# Patient Record
Sex: Male | Born: 1960
Health system: Southern US, Community
[De-identification: ages and names within clinical notes are randomized; demographics above are authoritative.]

## PROBLEM LIST (undated history)

## (undated) DIAGNOSIS — N200 Calculus of kidney: Secondary | ICD-10-CM

## (undated) DIAGNOSIS — I10 Essential (primary) hypertension: Secondary | ICD-10-CM

## (undated) DIAGNOSIS — F17200 Nicotine dependence, unspecified, uncomplicated: Secondary | ICD-10-CM

## (undated) DIAGNOSIS — M549 Dorsalgia, unspecified: Secondary | ICD-10-CM

## (undated) DIAGNOSIS — K635 Polyp of colon: Secondary | ICD-10-CM

## (undated) DIAGNOSIS — G950 Syringomyelia and syringobulbia: Secondary | ICD-10-CM

## (undated) HISTORY — DX: Essential (primary) hypertension: I10

## (undated) HISTORY — DX: Calculus of kidney: N20.0

## (undated) HISTORY — DX: Syringomyelia and syringobulbia: G95.0

## (undated) HISTORY — DX: Nicotine dependence, unspecified, uncomplicated: F17.200

## (undated) HISTORY — DX: Polyp of colon: K63.5

## (undated) HISTORY — PX: HERNIA REPAIR: SHX51

## (undated) HISTORY — DX: Dorsalgia, unspecified: M54.9

---
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
## 1998-04-18 ENCOUNTER — Ambulatory Visit (HOSPITAL_COMMUNITY): Admission: RE | Admit: 1998-04-18 | Discharge: 1998-04-18 | Payer: Self-pay | Admitting: Gastroenterology

## 2003-01-22 ENCOUNTER — Encounter: Payer: Self-pay | Admitting: Cardiology

## 2003-01-22 ENCOUNTER — Inpatient Hospital Stay (HOSPITAL_COMMUNITY): Admission: EM | Admit: 2003-01-22 | Discharge: 2003-01-24 | Payer: Self-pay | Admitting: Emergency Medicine

## 2003-01-22 ENCOUNTER — Encounter: Payer: Self-pay | Admitting: Emergency Medicine

## 2003-01-23 ENCOUNTER — Encounter: Payer: Self-pay | Admitting: Cardiology

## 2003-01-31 ENCOUNTER — Encounter: Admission: RE | Admit: 2003-01-31 | Discharge: 2003-01-31 | Payer: Self-pay | Admitting: Family Medicine

## 2003-01-31 ENCOUNTER — Encounter: Payer: Self-pay | Admitting: Family Medicine

## 2007-10-21 ENCOUNTER — Emergency Department (HOSPITAL_COMMUNITY): Admission: EM | Admit: 2007-10-21 | Discharge: 2007-10-21 | Payer: Self-pay | Admitting: Emergency Medicine

## 2009-08-09 ENCOUNTER — Emergency Department (HOSPITAL_COMMUNITY): Admission: EM | Admit: 2009-08-09 | Discharge: 2009-08-10 | Payer: Self-pay | Admitting: Emergency Medicine

## 2010-07-22 LAB — DIFFERENTIAL
Eosinophils Absolute: 0.2 10*3/uL (ref 0.0–0.7)
Lymphs Abs: 1.5 10*3/uL (ref 0.7–4.0)
Monocytes Absolute: 0.7 10*3/uL (ref 0.1–1.0)
Monocytes Relative: 7 % (ref 3–12)
Neutro Abs: 7.4 10*3/uL (ref 1.7–7.7)
Neutrophils Relative %: 75 % (ref 43–77)

## 2010-07-22 LAB — POCT CARDIAC MARKERS
CKMB, poc: 4.7 ng/mL (ref 1.0–8.0)
Myoglobin, poc: 137 ng/mL (ref 12–200)
Myoglobin, poc: 184 ng/mL (ref 12–200)
Myoglobin, poc: 228 ng/mL (ref 12–200)
Troponin i, poc: <0.05 ng/mL (ref 0.00–0.09)

## 2010-07-22 LAB — CBC
HCT: 45.3 % (ref 39.0–52.0)
MCHC: 33.4 g/dL (ref 30.0–36.0)

## 2010-07-22 LAB — COMPREHENSIVE METABOLIC PANEL
AST: 59 U/L — ABNORMAL HIGH (ref 0–37)
Alkaline Phosphatase: 55 U/L (ref 39–117)
Calcium: 9.1 mg/dL (ref 8.4–10.5)
Chloride: 106 mEq/L (ref 96–112)
Total Bilirubin: 0.6 mg/dL (ref 0.3–1.2)

## 2010-07-22 LAB — URINALYSIS, ROUTINE W REFLEX MICROSCOPIC
Bilirubin Urine: NEGATIVE
Hgb urine dipstick: NEGATIVE
Specific Gravity, Urine: 1.025 (ref 1.005–1.030)

## 2010-07-22 LAB — URINE MICROSCOPIC-ADD ON

## 2010-09-18 NOTE — H&P (Signed)
NAME:  Evan Wallace, Evan Wallace NO.:  [id number]   MEDICAL RECORD NO.:  [id number]                   PATIENT TYPE:  EMS   LOCATION:  [facility]                                 FACILITY:  [facility]   PHYSICIAN:  Rollene Rotunda, M.D.                DATE OF BIRTH:  1961/02/13   DATE OF ADMISSION:  01/22/2003  DATE OF DISCHARGE:                                HISTORY & PHYSICAL   REASON FOR PRESENTATION:  Back pain with chest pain.   HISTORY OF PRESENT ILLNESS:  The patient is a 50 year old gentleman with no  prior cardiac history.  He reports chest pain for the past 6-7 days.  This  has been occurring at night.  It wakes him from his sleep.  He describes a  substernal pressure.  It is somewhat different than his previous pain he has  had related to hiatal hernia.  It is higher than that previous pain.  It has  been  radiating through to his back.  He has had no radiation to his jaw or  to his arms.  The severe symptoms persist for several minutes.  It seems to  ease but may be noticeable throughout the night.  He wakes in the morning  and goes to work and does not notice pain during the day.  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  He cannot bring on the chest discomfort  with this.  There has been no associated nausea, vomiting, diaphoresis.  He  has had no palpitations, presyncope or syncope.   PAST MEDICAL HISTORY:  1. Esophageal reflux/hiatal hernia.  2. Hyperlipidemia.  3. Tobacco use.   PAST SURGICAL HISTORY:  1. Left herniorrhaphy.  2. Tonsillectomy.   ALLERGIES:  None.   MEDICATIONS:  1. Lipitor 10 mg q.h.s.  2. Prevacid 30 mg q.d. (he has been taking this consistently for 3-4 days).   SOCIAL HISTORY:  The patient lives in [place] with his wife.  He works in  Therapist, music, and he hand makes spare parts.  He has been smoking  one-half pack per day for approximately eight years.   FAMILY HISTORY:  Noncontributory for early coronary  artery disease.   REVIEW OF SYSTEMS:  Positive for nocturia and otherwise as stated in the  HPI.  Negative for all other systems.   PHYSICAL EXAMINATION:  GENERAL:  The patient is in no distress.  VITAL SIGNS:  Blood pressure 142/94, heart rate 56 and regular, afebrile.  HEENT:  Eyes unremarkable.  Pupils equal, round and react to light.  Fundi  not visualized.  Oral mucosa unremarkable.  NECK:  No jugular venous distention, wave form within normal limits.  Carotid upstroke brisk and symmetric, no bruits, thyromegaly.  LYMPHATICS:  No cervical, axillary, inguinal adenopathy.  LUNGS:  Clear to auscultation bilaterally.  BACK:  No costovertebral angle tenderness.  CHEST:  Unremarkable.  HEART:  PMI  not displaced or sustained.  S1 and S2 within normal limits, no  S3, no S4, no murmurs.  ABDOMEN:  Flat, positive bowel sounds, normal in frequency and pitch.  No  bruits, rebound, guarding.  No midline pulsatile mass, hepatomegaly,  splenomegaly.  SKIN:  No rash, nodules.  EXTREMITIES:  2+ pulses without clubbing, cyanosis or edema.  NEUROLOGIC:  Oriented to person, place and time.  Cranial nerves II-XII  grossly intact.  Motor grossly intact throughout.   LABORATORY DATA:  Sinus rhythm, axis within normal limits, intervals within  normal limits.  No acute ST/T wave changes.   WBC 8.6, hemoglobin 15.1, platelets 221; sodium 137, potassium 3.6, BUN 10,  creatinine 1; INR 0.9; AST 34, ALT 55, alkaline phosphatase 66; CK 329, MB  6.9, troponin less than 0.01.   ASSESSMENT/PLAN:  1. Chest pain.  The patient's chest pain has predominately atypical     features.  There is no objective evidence of ischemia; however, he does     have cardiovascular risk factors.  The plan will be observation and rule     out for myocardial infarction.  He will have an exercise Cardiolite in     the a.m. if his enzymes are negative.  2. Tobacco.  He will be educated about the need to stop smoking.  3. Risk  reduction.  We can check a lipid profile.  He will continue his     Lipitor.  4. Hypertension.  His blood pressure is slightly elevated today; however, he     reports that it has been well controlled previously.  We will educate     about therapeutic lifestyle changes.  Will have a low threshold for     initiating, perhaps, a diuretic if he remains hypertensive during his     hospitalization.                                                Rollene Rotunda, M.D.    JH/MEDQ  D:  01/22/2003  T:  01/23/2003  Job:  [id number]

## 2010-09-18 NOTE — Discharge Summary (Signed)
NAME:  Evan Wallace, Evan Wallace NO.:  [id number]   MEDICAL RECORD NO.:  [id number]                   PATIENT TYPE:  INP   LOCATION:  2024                                 FACILITY:  [facility]   PHYSICIAN:  Rollene Rotunda, M.D.                DATE OF BIRTH:  Oct 09, 1960   DATE OF ADMISSION:  01/22/2003  DATE OF DISCHARGE:  01/24/2003                           DISCHARGE SUMMARY - REFERRING   CARDIOLOGIST ON ADMISSION:  Rollene Rotunda, M.D.   PRIMARY CARE PHYSICIAN:  [place]ley C. Andrey Campanile, M.D.   DISCHARGE DIAGNOSES:  1. Admitted with substernal chest pressure, which awakens patient from sleep     at night over the last week.  2. Cardiac enzymes are negative.  3. Exercise Cardiolite study showed no evidence of ischemia with an ejection     fraction of 59%.   SECONDARY DIAGNOSES:  1. Esophageal reflux/hiatal hernia.  2. Hyperlipidemia.  3. History of long-term continued tobacco habituation.  4. Status post left herniorrhaphy.  5. Status post tonsillectomy.   PROCEDURE:  January 23, 2003 exercise Cardiolite study, which was negative  for ischemia; ejection fraction 59%.  The patient was unable to exercise to  maximum heart rate and the study was aborted early secondary to fatigue and  dyspnea.   DISCHARGE DISPOSITION:  Mr. Evan Wallace was ready for discharge,  September 23rd.  He has been afebrile this hospitalization.  His mental  status is clear.  He is ambulating independently.  He has had no recurrence  of his chest pain; and, in fact, has been sleeping well.   LABORATORY DATA:  The patient's cardiac enzymes on admission, September 21st  at midnight:  CK was 254 and CK MB 5.  Troponin I 0.01.  September 21st at  1550 hours:  CK 329, CK MB 6.9 and troponin I less than 0.01.  September  22nd at 1745 hours:  CK 206, CK MB 4.9 and troponin I less than 0.01.  September 23rd at 0155 hours:  CK 172, CK MB 3.9 and troponin I 0.01.   BRIEF HISTORY:  The patient  is a 50 year old gentleman.  He has no prior  cardiac history.  He reports chest pain for the past six to seven days.  This has been occurring at night. It wakes him up from sleep.  He describes  it as a substernal pressure.  It is somewhat different from the previous  pain he has had related to a hiatal hernia.  It is higher than the previous  pain.  It has radiated through to the back.  He has no radiation to the jaw  or arms.  The severe symptoms persist for several minutes.  The symptoms  ease, but may be noticeable throughout the night.  He awakes in the morning,  goes to work and does not notice pain during the day.  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  He cannot bring on chest discomfort with  exertion.  There has been no associated nausea, vomiting or diaphoresis.  He  has had no history of palpitations or presyncope, or syncope.  The patient  will be admitted.  He will have an exercise Cardiolite.  If this is positive  he will go to left heart catheterization and if it is negative he will be  discharged.   HOSPITAL COURSE:  The patient was admitted with a one-week history of  substernal chest pain, which awoke him at night.  This resolved and he was  able to sleep through the rest of the night.  It did not occur during the  day at work.  He underwent exercise Cardiolite study September 22nd and this  was negative.  The patient had a full set of cardiac enzymes, which have  been dictated above; these were essentially negative.  The patient was  discharged September 23rd.   During this hospitalization he has had a smoking cessation consult.  The  benefits and risks were stressed.  He will have follow up from our smoking  cessation counselor.   ADDITIONAL LABORATORY DATA:  Complete blood count on admission:  Hemoglobin  15.1, hematocrit 45, white cells 8.6 and platelets 221,000.  Serum  electrolytes on admission: Sodium 137, potassium 3.6, chloride 104,   bicarbonate 27, BUN 10, creatinine 1, and glucose 77.  Coagulation studies:  PT 12.7, INR 0.9 and PTT 29. Alk phosphatase 66, SGPT 34, SGPT 55.  Lipid  profile:  Cholesterol 120 triglycerides 96, HDL cholesterol 29 and LDL  cholesterol 72.  His LDL cholesterol was well-controlled on the Lipitor 10  mg he does take; however, his HDL cholesterol was low and the subject of  Niaspan had been discussed with the patient.  He stated he would take this  up with his primary caregiver, Dr. Margrett Rud.   DISCHARGE MEDICATIONS:  Mr. Evan Wallace goes home on the following  medications:  1. Enteric coated aspirin 81 mg daily.  2. Prevacid 3 mg daily.  3. Lipitor 10 mg daily at bedtime.   DISCHARGE ACTIVITIES:  No restrictions.   DISCHARGE DIET:  Low-sodium, low-cholesterol diet.   FOLLOW UP:  The patient is to follow up with Dr. Andrey Campanile who will have the  results of this hospitalization and the tests.        [name], P.A.                    Rollene Rotunda, M.D.    GM/MEDQ  D:  01/24/2003  T:  01/24/2003  Job:  [id number]   cc:   [facility]   [place] C. Andrey Campanile, M.D.  [address]  [place]  [place] [address]  Fax: [phone]

## 2011-01-28 LAB — URINE MICROSCOPIC-ADD ON

## 2011-01-28 LAB — DIFFERENTIAL
Eosinophils Relative: 2
Monocytes Absolute: 0.4
Monocytes Relative: 6
Neutrophils Relative %: 70

## 2011-01-28 LAB — URINALYSIS, ROUTINE W REFLEX MICROSCOPIC
Bilirubin Urine: NEGATIVE
Leukocytes, UA: NEGATIVE
Nitrite: NEGATIVE
Protein, ur: NEGATIVE
Urobilinogen, UA: 0.2

## 2011-01-28 LAB — CBC
HCT: 42.8
Hemoglobin: 14.5
MCHC: 34
RBC: 4.72

## 2011-01-28 LAB — POCT I-STAT, CHEM 8
BUN: 22
Chloride: 107
Creatinine, Ser: 1.3
Glucose, Bld: 141 — ABNORMAL HIGH
HCT: 45
Potassium: 4.5

## 2011-11-20 IMAGING — CT CT CERVICAL SPINE W/O CM
2 of 6 series · 8 of 30 positions shown, 9 images · non-contrast
Comparison: None.

CT HEAD

CLINICAL DATA: Syncope with slurred speech. History of
hypertension.  History of tobacco use disorder.

CT HEAD WITHOUT CONTRAST
CT CERVICAL SPINE WITHOUT CONTRAST
TECHNIQUE: Multidetector CT imaging of the head and cervical spine
was performed following the standard protocol without intravenous
contrast.  Multiplanar CT image reconstructions of the cervical
spine were also generated.

[Series 4: cervical spine · axial · 0.27mm/px · z∈[-268,-208]mm · 2 of 72 slices shown, 3 images]
[im 24/72  soft-tissue]
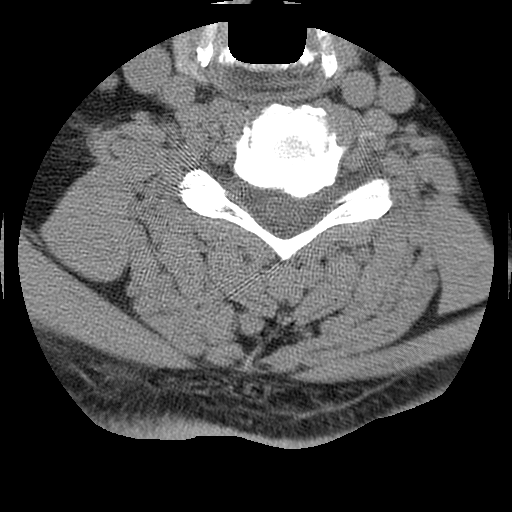
[im 24/72  bone]
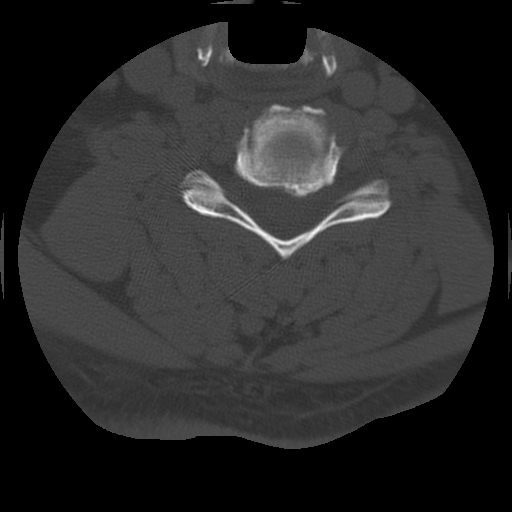
[im 48/72  bone]
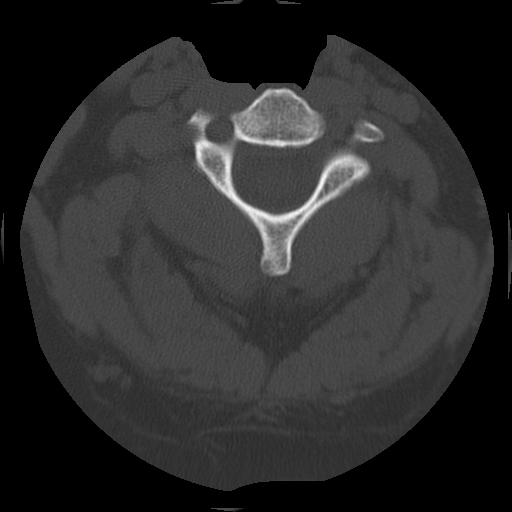

[Series 600: cor · coronal · 0.36mm/px · 6 of 34 slices shown]
[im 7/34  soft-tissue]
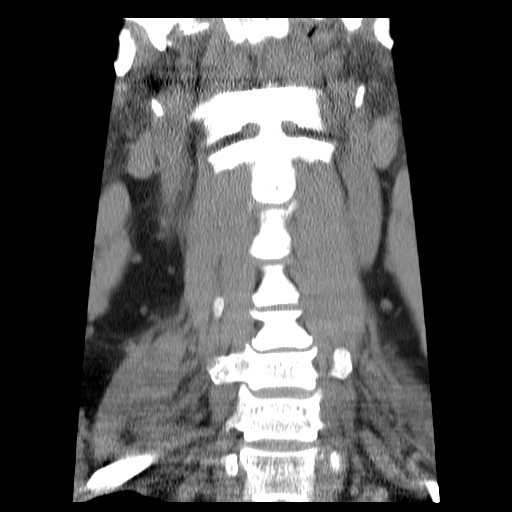
[im 12/34  bone]
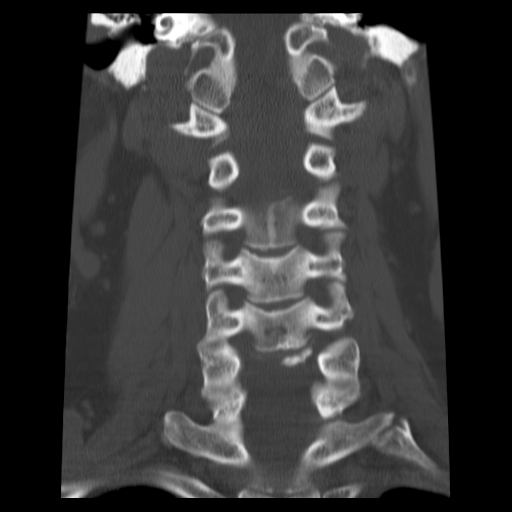
[im 14/34  bone]
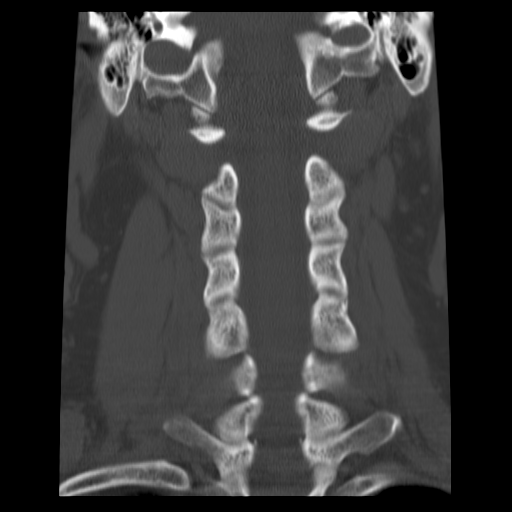
[im 17/34  bone]
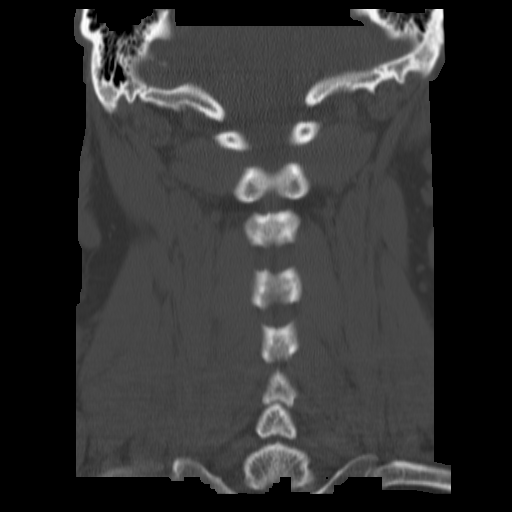
[im 20/34  bone]
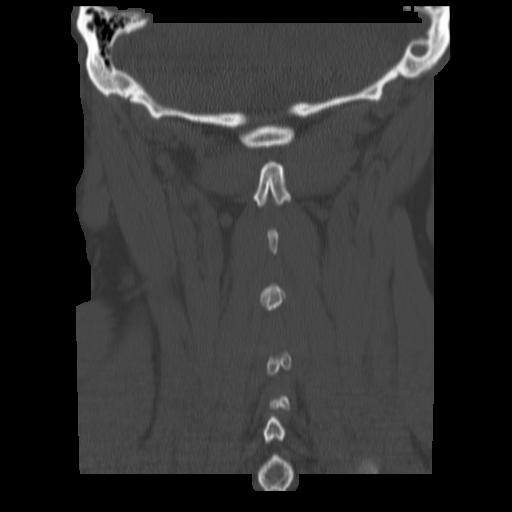
[im 23/34  bone]
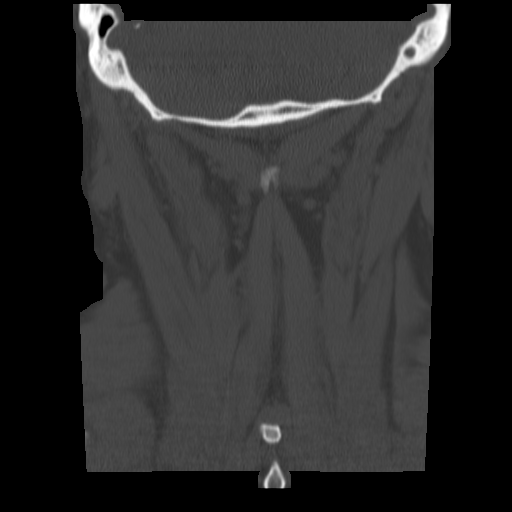

[8 of 30 positions shown; findings below may reference images not displayed]

FINDINGS: There is no evidence for acute infarction, intracranial
hemorrhage, mass lesion, hydrocephalus, or extra-axial fluid. There
is mild atrophy and chronic microvascular ischemic change.
Premature vascular calcification is seen in the carotid siphon
regions.  Calvarium is intact.  Sinuses show mild chronic mucosal
thickening and retention cyst formation.  Mastoid air cells are
clear.
IMPRESSION: No acute intracranial findings.  Mild atrophy and small vessel
disease.

CT CERVICAL SPINE
FINDINGS: There is mild reversal of the normal cervical lordotic
curve which is likely positional, although potentially due to
spasm.  Moderate spondylosis is present in the lower cervical spine
particularly at C5-6.  There is no fracture or traumatic
subluxation.  No neck masses are seen.
IMPRESSION: Cervical spondylosis.  No acute fracture or traumatic subluxation.
Mild reversal of the normal cervical lordotic curve of uncertain
significance.

## 2013-11-13 ENCOUNTER — Encounter: Payer: Self-pay | Admitting: Family Medicine

## 2013-11-13 ENCOUNTER — Ambulatory Visit (INDEPENDENT_AMBULATORY_CARE_PROVIDER_SITE_OTHER): Payer: BC Managed Care – PPO | Admitting: Family Medicine

## 2013-11-13 VITALS — BP 110/76 | HR 55 | Temp 98.7°F | Ht 71.0 in | Wt 225.5 lb

## 2013-11-13 DIAGNOSIS — Z800 Family history of malignant neoplasm of digestive organs: Secondary | ICD-10-CM | POA: Insufficient documentation

## 2013-11-13 DIAGNOSIS — F17200 Nicotine dependence, unspecified, uncomplicated: Secondary | ICD-10-CM | POA: Insufficient documentation

## 2013-11-13 DIAGNOSIS — E669 Obesity, unspecified: Secondary | ICD-10-CM | POA: Insufficient documentation

## 2013-11-13 DIAGNOSIS — Z7189 Other specified counseling: Secondary | ICD-10-CM

## 2013-11-13 DIAGNOSIS — I10 Essential (primary) hypertension: Secondary | ICD-10-CM

## 2013-11-13 DIAGNOSIS — Z7689 Persons encountering health services in other specified circumstances: Secondary | ICD-10-CM

## 2013-11-13 HISTORY — DX: Nicotine dependence, unspecified, uncomplicated: F17.200

## 2013-11-13 NOTE — Patient Instructions (Addendum)
-  PLEASE SIGN UP FOR MYCHART TODAY   We recommend the following healthy lifestyle measures: - eat a healthy diet consisting of lots of vegetables, fruits, beans, nuts, seeds, healthy meats such as white chicken and fish and whole grains.  - avoid fried foods, fast food, processed foods, sodas, red meet and other fattening foods.  - get a least 150 minutes of aerobic exercise per week.   Please quit smoking - call for a quit coach and let us know how we can help  Schedule colonoscopy  Follow up in: 3-4 months for a morning appointment for your physical, come fasting

## 2013-11-13 NOTE — Progress Notes (Signed)
Pre visit review using our clinic review tool, if applicable. No additional management support is needed unless otherwise documented below in the visit note. 

## 2013-11-13 NOTE — Progress Notes (Signed)
No chief complaint on file.   HPI:  Evan Wallace is here to establish care. Used to see Dr. Lisbeth Ply but he did not like the staff appointment. Last PCP and physical: has been a few years  Has the following chronic problems and concerns today:  Patient Active Problem List   Diagnosis Date Noted  . Essential hypertension, benign 11/13/2013  . Tobacco use disorder 11/13/2013  . Obesity, unspecified 11/13/2013  . FH: colon cancer 11/13/2013   Hx of mid back pain: -seeing Dr. Nelva Bush at [facility] ortho -he has had imaging and MRI, has cyst in his spine that they are monitoring -getting PT for L shoulder too -doing better, was taking neurontin - but weaning off of this as not needing this -denies: fevers, weakness or numbness  HTN: -started on lisinopril in 2010 or so -stable -denies: CP, SOB, swelling  Tobacco Use: -3/4 ppd for for 10-12 years -feels like he could quit -not really motivated to quit -reports is his only vice and he likes it -aware of options to quit  ROS negative for unless reported above: fevers, unintentional weight loss, hearing or vision loss, chest pain, palpitations, struggling to breath, hemoptysis, melena, hematochezia, hematuria, falls, loc, si, thoughts of self harm  FH colon cancer - he called to set up colonoscopy today, getting this in October, last in 2007  Past Medical History  Diagnosis Date  . Back pain   . Hypertension   . Kidney stones   . Colon polyps     Family History  Problem Relation Age of Onset  . Hypertension Mother   . Hypertension Father   . Kidney disease Father   . Cancer Father 67    colon cancer    History   Social History  . Marital Status: Married    Spouse Name: N/A    Number of Children: N/A  . Years of Education: N/A   Social History Main Topics  . Smoking status: Current Every Day Smoker -- 1.00 packs/day  . Smokeless tobacco: None  . Alcohol Use: None  . Drug Use: None  . Sexual Activity: None    Other Topics Concern  . None   Social History Narrative   Work or School: works for [employer] Situation: lives with wife      Spiritual Beliefs: methodist      Lifestyle: no regular exercise; so so             Current outpatient prescriptions:gabapentin (NEURONTIN) 300 MG capsule, Take 300 mg by mouth 2 (two) times daily., Disp: , Rfl: ;  lisinopril (PRINIVIL,ZESTRIL) 40 MG tablet, Take 40 mg by mouth daily., Disp: , Rfl:   EXAM:  Filed Vitals:   11/13/13 1057  BP: 110/76  Pulse: 55  Temp: 98.7 F (37.1 C)    Body mass index is 31.46 kg/(m^2).  GENERAL: vitals reviewed and listed above, alert, oriented, appears well hydrated and in no acute distress  HEENT: atraumatic, conjunttiva clear, no obvious abnormalities on inspection of external nose and ears  NECK: no obvious masses on inspection  LUNGS: clear to auscultation bilaterally, no wheezes, rales or rhonchi, good air movement  CV: HRRR, no peripheral edema  MS: moves all extremities without noticeable abnormality  PSYCH: pleasant and cooperative, no obvious depression or anxiety  ASSESSMENT AND PLAN:  Discussed the following assessment and plan:  Essential hypertension, benign  Tobacco use disorder  Obesity, unspecified  FH: colon cancer  Encounter to establish care   -We reviewed the PMH, PSH, FH, SH, Meds and Allergies. -We provided refills for any medications we will prescribe as needed. -We addressed current concerns per orders and patient instructions. -We have asked for records for pertinent exams, studies, vaccines and notes from previous providers. -We have advised patient to follow up per instructions below. -smoking cessation counseling for 3-10 minutes, discussed options, quit coach info provided, he will let us know if he wants to quit  -Patient advised to return or notify a doctor immediately if symptoms worsen or persist or new concerns  arise.  Patient Instructions  -PLEASE SIGN UP FOR MYCHART TODAY   We recommend the following healthy lifestyle measures: - eat a healthy diet consisting of lots of vegetables, fruits, beans, nuts, seeds, healthy meats such as white chicken and fish and whole grains.  - avoid fried foods, fast food, processed foods, sodas, red meet and other fattening foods.  - get a least 150 minutes of aerobic exercise per week.   Please quit smoking - call for a quit coach and let us know how we can help  Schedule colonoscopy  Follow up in: 3-4 months for a morning appointment for your physical, come fasting      Savera Donson R.

## 2013-11-14 ENCOUNTER — Telehealth: Payer: Self-pay | Admitting: Family Medicine

## 2013-11-14 NOTE — Telephone Encounter (Signed)
Relevant patient education mailed to patient.  

## 2013-11-14 NOTE — Telephone Encounter (Signed)
Relevant patient education assigned to patient using Emmi. ° °

## 2013-11-21 ENCOUNTER — Encounter: Payer: Self-pay | Admitting: *Deleted

## 2013-11-30 ENCOUNTER — Encounter: Payer: Self-pay | Admitting: Family Medicine

## 2013-11-30 DIAGNOSIS — M5030 Other cervical disc degeneration, unspecified cervical region: Secondary | ICD-10-CM | POA: Insufficient documentation

## 2013-11-30 DIAGNOSIS — G950 Syringomyelia and syringobulbia: Secondary | ICD-10-CM

## 2013-11-30 HISTORY — DX: Syringomyelia and syringobulbia: G95.0

## 2013-12-12 ENCOUNTER — Telehealth: Payer: Self-pay | Admitting: Family Medicine

## 2013-12-12 NOTE — Telephone Encounter (Signed)
Pt states dr Maudie Mercury told him he could start chantix when ready to stop smoling.. Pt is ready!! [facility]/ [place]

## 2013-12-14 ENCOUNTER — Telehealth: Payer: Self-pay | Admitting: *Deleted

## 2013-12-14 MED ORDER — VARENICLINE TARTRATE 0.5 MG X 11 & 1 MG X 42 PO MISC: ORAL | Status: DC

## 2013-12-14 MED ORDER — VARENICLINE TARTRATE 1 MG PO TABS: 1.0000 mg | ORAL_TABLET | Freq: Two times a day (BID) | ORAL | Status: DC

## 2013-12-14 NOTE — Telephone Encounter (Signed)
YAY! Please congratulate him! Please send starter pack and continuation pack (1 refill on continuation pack). He can review adverse effects again with pharmacist. Please advise him to follow up with me about 1 month after starting this medication. Thanks!!!

## 2013-12-14 NOTE — Telephone Encounter (Signed)
Rx was sent. I called the pt and left a detailed message at his home phone number with the information below and asked that he call to schedule the follow up visit.

## 2013-12-14 NOTE — Telephone Encounter (Signed)
Rx for the Chantix starter pak was printed and I called this in to [facility] at [phone].

## 2014-01-08 ENCOUNTER — Telehealth: Payer: Self-pay | Admitting: Family Medicine

## 2014-01-08 NOTE — Telephone Encounter (Signed)
Pt called would like to know how long he need to keep taking chantix . He said he has not smoked in 3 weeks

## 2014-01-09 NOTE — Telephone Encounter (Signed)
GREAT! So happy about this!Usually advise 2.5 to 3 months after quit date - usually 3 months total. Usually advise follow up with me 6 weeks after starting the chantix.

## 2014-01-09 NOTE — Telephone Encounter (Signed)
Patient informed and he has an appt on 10/20 for his physical and will discuss at that time.

## 2014-01-09 NOTE — Telephone Encounter (Signed)
I left a message at the pts home number to return my call. 

## 2014-01-10 ENCOUNTER — Telehealth: Payer: Self-pay | Admitting: Family Medicine

## 2014-01-10 NOTE — Telephone Encounter (Signed)
I called the pt and he stated he has not been sleeping well for the past week and he read the pamphlet and noticed this was a side effect of the Chantix.  Message forwarded to Dr Maudie Mercury.

## 2014-01-10 NOTE — Telephone Encounter (Signed)
Pt would like to speak w/ you about his chantix rx

## 2014-01-11 NOTE — Telephone Encounter (Signed)
Patient informed and states he has noticed a difference in the dreams he has had, will begin taking his every other night and will call back if needed.

## 2014-01-11 NOTE — Telephone Encounter (Signed)
This may be more related to the nicotine withdrawal - however if having bad nightmares or night terrors would advise trying to take the chantix every other night. Could try this regardless and see if sleep improves. Call if further questions - so happy he quit!

## 2014-02-19 ENCOUNTER — Ambulatory Visit (INDEPENDENT_AMBULATORY_CARE_PROVIDER_SITE_OTHER): Payer: BC Managed Care – PPO | Admitting: Family Medicine

## 2014-02-19 ENCOUNTER — Encounter: Payer: Self-pay | Admitting: Family Medicine

## 2014-02-19 VITALS — BP 138/90 | HR 57 | Temp 98.2°F | Ht 71.0 in | Wt 229.5 lb

## 2014-02-19 DIAGNOSIS — F17200 Nicotine dependence, unspecified, uncomplicated: Secondary | ICD-10-CM

## 2014-02-19 DIAGNOSIS — D229 Melanocytic nevi, unspecified: Secondary | ICD-10-CM

## 2014-02-19 DIAGNOSIS — G950 Syringomyelia and syringobulbia: Secondary | ICD-10-CM

## 2014-02-19 DIAGNOSIS — I10 Essential (primary) hypertension: Secondary | ICD-10-CM

## 2014-02-19 DIAGNOSIS — E669 Obesity, unspecified: Secondary | ICD-10-CM

## 2014-02-19 DIAGNOSIS — Z23 Encounter for immunization: Secondary | ICD-10-CM

## 2014-02-19 DIAGNOSIS — Z800 Family history of malignant neoplasm of digestive organs: Secondary | ICD-10-CM

## 2014-02-19 DIAGNOSIS — D239 Other benign neoplasm of skin, unspecified: Secondary | ICD-10-CM

## 2014-02-19 DIAGNOSIS — Z0000 Encounter for general adult medical examination without abnormal findings: Secondary | ICD-10-CM

## 2014-02-19 DIAGNOSIS — Z720 Tobacco use: Secondary | ICD-10-CM

## 2014-02-19 LAB — BASIC METABOLIC PANEL
BUN: 16 mg/dL (ref 6–23)
CALCIUM: 9.6 mg/dL (ref 8.4–10.5)
CO2: 27 meq/L (ref 19–32)
Chloride: 107 mEq/L (ref 96–112)
Creatinine, Ser: 0.9 mg/dL (ref 0.4–1.5)
GFR: 89.81 mL/min (ref >60.00)
GLUCOSE: 87 mg/dL (ref 70–99)
Potassium: 4.2 mEq/L (ref 3.5–5.1)
SODIUM: 142 meq/L (ref 135–145)

## 2014-02-19 LAB — LIPID PANEL
CHOLESTEROL: 178 mg/dL (ref 0–200)
HDL: 33.8 mg/dL — ABNORMAL LOW (ref >39.00)
LDL CALC: 129 mg/dL — AB (ref 0–99)
NONHDL: 144.2
TRIGLYCERIDES: 75 mg/dL (ref 0.0–149.0)
Total CHOL/HDL Ratio: 5
VLDL: 15 mg/dL (ref 0.0–40.0)

## 2014-02-19 LAB — HEMOGLOBIN A1C: Hgb A1c MFr Bld: 6 % (ref 4.6–6.5)

## 2014-02-19 NOTE — Addendum Note (Signed)
Addended by: Agnes Lawrence on: 02/19/2014 09:23 AM   Modules accepted: Orders

## 2014-02-19 NOTE — Patient Instructions (Signed)
BEFORE YOU LEAVE: -flu and Tdap vaccines -go to the lab  Call the dermatologist to remove the mole on the Left Lower back  Try cutting back to 1 pill of chantix twice per week for a few weeks then stop  CONGRATULATIONS ON QUITTING SMOKING!!!!!  -We have ordered labs or studies at this visit. It can take up to 1-2 weeks for results and processing. We will contact you with instructions IF your results are abnormal. Normal results will be released to your [facility]. If you have not heard from [place] or can not find your results in [facility] in 2 weeks please contact our office.  -PLEASE SIGN UP FOR MYCHART TODAY   We recommend the following healthy lifestyle measures: - eat a healthy diet consisting of lots of vegetables, fruits, beans, nuts, seeds, healthy meats such as white chicken and fish and whole grains.  - avoid fried foods, fast food, processed foods, sodas, red meet and other fattening foods.  - get a least 150 minutes of aerobic exercise per week.

## 2014-02-19 NOTE — Progress Notes (Signed)
Pre visit review using our clinic review tool, if applicable. No additional management support is needed unless otherwise documented below in the visit note. 

## 2014-02-19 NOTE — Progress Notes (Signed)
No chief complaint on file.   HPI:  Here for CPE:  -Concerns and/or follow up today:   Hx of mid back pain:  -seeing Dr. Nelva Bush at [facility] ortho  -he has had imaging and MRI, has cyst in his spine that they are monitoring  -getting PT for L shoulder too  -doing better, was taking neurontin - but weaning off of this as not needing this  -denies: fevers, weakness or numbness   HTN:  -started on lisinopril in 2010 or so  -reports his wife's doctor told him this medication causes - syncope, but he tolerates this well -stable  -denies: CP, SOB, swelling   Tobacco Use:  -3/4 ppd for for 10-12 years  -requested chantix since last visit and quit smoking 9 weeks ago -feels like is upsetting his stomach and sleep - has had a few episodes since starting chantix -denies: vomiting, blood in stools, change in bowels, weight loss  -Diet: variety of foods, balance and well rounded - needs to work on portion size  -Exercise: no regular exercise, but work in garden frequently in summer  -Diabetes and Dyslipidemia Screening: doing today - FASTING  -Vaccines: flu and Tdap   -sexual activity: yes, male partner, no new partners  -wants STI testing, Hep C screening (if born 47-1966): no  -FH colon or prstate ca: see FH Last colon cancer screening: scheduled Last prostate ca screening: n/a  -Alcohol, Tobacco, drug use: see social history  Review of Systems - no fevers, unintentional weight loss, vision loss, hearing loss, chest pain, sob, hemoptysis, melena, hematochezia, hematuria, genital discharge, changing or concerning skin lesions, bleeding, bruising, loc, thoughts of self harm or SI  Past Medical History  Diagnosis Date  . Back pain   . Hypertension   . Kidney stones   . Colon polyps   . Syrinx of spinal cord - on MRI 08/2013, followed by orthopedics 11/30/2013    Past Surgical History  Procedure Laterality Date  . Hernia repair    . Tonsillectomy  1967    Family  History  Problem Relation Age of Onset  . Hypertension Mother   . Hypertension Father   . Kidney disease Father   . Cancer Father 59    colon cancer    History   Social History  . Marital Status: Married    Spouse Name: N/A    Number of Children: N/A  . Years of Education: N/A   Social History Main Topics  . Smoking status: Former Smoker -- 1.00 packs/day    Quit date: 12/21/2013  . Smokeless tobacco: None  . Alcohol Use: None  . Drug Use: None  . Sexual Activity: None   Other Topics Concern  . None   Social History Narrative   Work or School: works for [employer] Situation: lives with wife      Spiritual Beliefs: methodist      Lifestyle: no regular exercise; so so             Current outpatient prescriptions:lisinopril (PRINIVIL,ZESTRIL) 40 MG tablet, Take 40 mg by mouth daily., Disp: , Rfl: ;  varenicline (CHANTIX CONTINUING MONTH PAK) 1 MG tablet, Take 1 tablet (1 mg total) by mouth 2 (two) times daily., Disp: 60 tablet, Rfl: 1  EXAM:  Filed Vitals:   02/19/14 0819  BP: 138/90  Pulse: 57  Temp: 98.2 F (36.8 C)  TempSrc: Oral  Height: 5\' 11"  (1.803 m)  Weight: 229  lb 8 oz (104.101 kg)    Estimated body mass index is 32.02 kg/(m^2) as calculated from the following:   Height as of this encounter: 5\' 11"  (1.803 m).   Weight as of this encounter: 229 lb 8 oz (104.101 kg).  GENERAL: vitals reviewed and listed below, alert, oriented, appears well hydrated and in no acute distress  HEENT: head atraumatic, PERRLA, normal appearance of eyes, ears, nose and mouth. moist mucus membranes.  NECK: supple, no masses or lymphadenopathy  LUNGS: clear to auscultation bilaterally, no rales, rhonchi or wheeze  CV: HRRR, no peripheral edema or cyanosis, normal pedal pulses  ABDOMEN: bowel sounds normal, soft, non tender to palpation, no masses, no rebound or guarding  GU: declined  RECTAL: refused  SKIN: scattered nevi and sks,  larger flat nevus L lower back that is much larger then the rest of his nevi  MS: normal gait, moves all extremities normally  NEURO: CN II-XII grossly intact, normal muscle strength and sensation to light touch on extremities  PSYCH: normal affect, pleasant and cooperative  ASSESSMENT AND PLAN:  Discussed the following assessment and plan:  Essential hypertension, benign - Plan: Hemoglobin B6L, Basic metabolic panel  Obesity - Plan: Lipid Panel  FH: colon cancer  Syrinx of spinal cord - on MRI 08/2013, followed by orthopedics  Tobacco use disorder  Visit for preventive health examination - Plan: Lipid Panel, Hemoglobin S9H, Basic metabolic panel  Atypical nevus -advised removal and he prefers to see dermatologist for this - number given to call to schedule appointment  -Discussed and advised all [place] preventive services health task force level A and B recommendations for age, sex and risks.  -Advised at least 150 minutes of exercise per week and a healthy diet low in saturated fats and sweets and consisting of fresh fruits and vegetables, lean meats such as fish and white chicken and whole grains.  -labs, studies and vaccines per orders this encounter   Patient advised to return to clinic immediately if symptoms worsen or persist or new concerns.  Patient Instructions  BEFORE YOU LEAVE: -flu and Tdap vaccines -go to the lab  Call the dermatologist to remove the mole on the Left Lower back  Try cutting back to 1 pill of chantix twice per week for a few weeks then stop  CONGRATULATIONS ON QUITTING SMOKING!!!!!  -We have ordered labs or studies at this visit. It can take up to 1-2 weeks for results and processing. We will contact you with instructions IF your results are abnormal. Normal results will be released to your [facility]. If you have not heard from [place] or can not find your results in [facility] in 2 weeks please contact our office.  -PLEASE SIGN UP FOR MYCHART TODAY    We recommend the following healthy lifestyle measures: - eat a healthy diet consisting of lots of vegetables, fruits, beans, nuts, seeds, healthy meats such as white chicken and fish and whole grains.  - avoid fried foods, fast food, processed foods, sodas, red meet and other fattening foods.  - get a least 150 minutes of aerobic exercise per week.           No Follow-up on file.   Colin Benton R.

## 2014-12-30 ENCOUNTER — Encounter: Payer: Self-pay | Admitting: Family Medicine

## 2014-12-30 ENCOUNTER — Ambulatory Visit (INDEPENDENT_AMBULATORY_CARE_PROVIDER_SITE_OTHER): Payer: BLUE CROSS/BLUE SHIELD | Admitting: Family Medicine

## 2014-12-30 VITALS — BP 122/80 | HR 50 | Temp 98.3°F | Ht 71.0 in | Wt 235.4 lb

## 2014-12-30 DIAGNOSIS — Z23 Encounter for immunization: Secondary | ICD-10-CM

## 2014-12-30 DIAGNOSIS — I10 Essential (primary) hypertension: Secondary | ICD-10-CM

## 2014-12-30 DIAGNOSIS — F17200 Nicotine dependence, unspecified, uncomplicated: Secondary | ICD-10-CM

## 2014-12-30 DIAGNOSIS — E669 Obesity, unspecified: Secondary | ICD-10-CM

## 2014-12-30 DIAGNOSIS — Z720 Tobacco use: Secondary | ICD-10-CM

## 2014-12-30 DIAGNOSIS — Z800 Family history of malignant neoplasm of digestive organs: Secondary | ICD-10-CM | POA: Diagnosis not present

## 2014-12-30 DIAGNOSIS — R739 Hyperglycemia, unspecified: Secondary | ICD-10-CM

## 2014-12-30 DIAGNOSIS — M5030 Other cervical disc degeneration, unspecified cervical region: Secondary | ICD-10-CM

## 2014-12-30 LAB — LIPID PANEL
CHOLESTEROL: 152 mg/dL (ref 0–200)
HDL: 34.5 mg/dL — ABNORMAL LOW (ref >39.00)
LDL Cholesterol: 105 mg/dL — ABNORMAL HIGH (ref 0–99)
NonHDL: 117.13
TRIGLYCERIDES: 62 mg/dL (ref 0.0–149.0)
Total CHOL/HDL Ratio: 4
VLDL: 12.4 mg/dL (ref 0.0–40.0)

## 2014-12-30 LAB — BASIC METABOLIC PANEL
BUN: 13 mg/dL (ref 6–23)
CO2: 30 meq/L (ref 19–32)
Calcium: 9.8 mg/dL (ref 8.4–10.5)
Chloride: 109 mEq/L (ref 96–112)
Creatinine, Ser: 0.94 mg/dL (ref 0.40–1.50)
GFR: 88.43 mL/min (ref >60.00)
GLUCOSE: 104 mg/dL — AB (ref 70–99)
POTASSIUM: 4.6 meq/L (ref 3.5–5.1)
SODIUM: 144 meq/L (ref 135–145)

## 2014-12-30 LAB — HEMOGLOBIN A1C: Hgb A1c MFr Bld: 5.8 % (ref 4.6–6.5)

## 2014-12-30 MED ORDER — LISINOPRIL 40 MG PO TABS: 40.0000 mg | ORAL_TABLET | Freq: Every day | ORAL | Status: DC

## 2014-12-30 NOTE — Addendum Note (Signed)
Addended by: Lucretia Kern on: 12/30/2014 09:10 AM   Modules accepted: Orders

## 2014-12-30 NOTE — Progress Notes (Signed)
Pre visit review using our clinic review tool, if applicable. No additional management support is needed unless otherwise documented below in the visit note. 

## 2014-12-30 NOTE — Patient Instructions (Signed)
BEFORE YOU LEAVE: -labs -schedule physical in 4-6 month -flu shot  Schedule your colonoscopy  We recommend the following healthy lifestyle measures: - eat a healthy diet consisting of lots of vegetables, fruits, beans, nuts, seeds, healthy meats such as white chicken and fish and whole grains.  - avoid fried foods, fast food, processed foods, sodas, red meet and other fattening foods.  - get a least 150 minutes of aerobic exercise per week.

## 2014-12-30 NOTE — Progress Notes (Signed)
HPI:  HM: -colon, hiv, hep c, flu  HTN:  -started on lisinopril in 2010  -stable  -denies: CP, SOB, swelling   Obesity/Hyperglycemia: -lifestyle recs, has not been as good, had shoulder surgery and was not active and ate poorly for some time  FH colon ca: -reports missed las colonoscopy but will rescheduledenies: B changes, melena, hematochezia  Tobacco Use:  -quit 1 year ago after our last visit!  Hx of mid back pain/DDD/R finger paresthesias:  -seeing Dr. Nelva Bush and supple at [place]  -denies: fevers, weakness or numbness   ROS: See pertinent positives and negatives per HPI.  Past Medical History  Diagnosis Date  . Back pain   . Hypertension   . Kidney stones   . Colon polyps   . Syrinx of spinal cord - on MRI 08/2013, followed by orthopedics 11/30/2013    Past Surgical History  Procedure Laterality Date  . Hernia repair    . Tonsillectomy  1967    Family History  Problem Relation Age of Onset  . Hypertension Mother   . Hypertension Father   . Kidney disease Father   . Cancer Father 62    colon cancer    Social History   Social History  . Marital Status: Married    Spouse Name: N/A  . Number of Children: N/A  . Years of Education: N/A   Social History Main Topics  . Smoking status: Former Smoker -- 1.00 packs/day    Quit date: 12/21/2013  . Smokeless tobacco: None  . Alcohol Use: None  . Drug Use: None  . Sexual Activity: Not Asked   Other Topics Concern  . None   Social History Narrative   Work or School: works for [employer] Situation: lives with wife      Spiritual Beliefs: methodist      Lifestyle: no regular exercise; so so              Current outpatient prescriptions:  .  lisinopril (PRINIVIL,ZESTRIL) 40 MG tablet, Take 40 mg by mouth daily., Disp: , Rfl:   EXAM:  Filed Vitals:   12/30/14 0844  BP: 122/80  Pulse: 50  Temp: 98.3 F (36.8 C)    Body mass index is 32.85  kg/(m^2).  GENERAL: vitals reviewed and listed above, alert, oriented, appears well hydrated and in no acute distress  HEENT: atraumatic, conjunttiva clear, no obvious abnormalities on inspection of external nose and ears  NECK: no obvious masses on inspection  LUNGS: clear to auscultation bilaterally, no wheezes, rales or rhonchi, good air movement  CV: HRRR, no peripheral edema  MS: moves all extremities without noticeable abnormality  PSYCH: pleasant and cooperative, no obvious depression or anxiety  ASSESSMENT AND PLAN:  Discussed the following assessment and plan:  Essential hypertension, benign - Plan: Basic metabolic panel -check labs, refills  Tobacco use disorder -congratulated  Obesity - Plan: Lipid Panel -lifestyle recs  FH: colon cancer -advised needs to schedule colonoscopy - he agrees to do this  DDD (degenerative disc disease), cervical -sees [place] ortho, discuss possibility CTS vs radicular symptoms, cock up brace and follow up with ortho  Hyperglycemia - Plan: Hemoglobin A1c -labs, lifestyle recs  Flu shot today -Patient advised to return or notify a doctor immediately if symptoms worsen or persist or new concerns arise.  Patient Instructions  BEFORE YOU LEAVE: -labs -schedule physical in 4-6 month -flu shot  Schedule your colonoscopy  We recommend  the following healthy lifestyle measures: - eat a healthy diet consisting of lots of vegetables, fruits, beans, nuts, seeds, healthy meats such as white chicken and fish and whole grains.  - avoid fried foods, fast food, processed foods, sodas, red meet and other fattening foods.  - get a least 150 minutes of aerobic exercise per week.       Colin Benton R.

## 2015-10-07 DIAGNOSIS — M170 Bilateral primary osteoarthritis of knee: Secondary | ICD-10-CM | POA: Diagnosis not present

## 2015-10-07 DIAGNOSIS — M1712 Unilateral primary osteoarthritis, left knee: Secondary | ICD-10-CM | POA: Diagnosis not present

## 2015-10-28 DIAGNOSIS — M170 Bilateral primary osteoarthritis of knee: Secondary | ICD-10-CM | POA: Diagnosis not present

## 2015-10-28 DIAGNOSIS — S83242D Other tear of medial meniscus, current injury, left knee, subsequent encounter: Secondary | ICD-10-CM | POA: Diagnosis not present

## 2015-11-08 DIAGNOSIS — M25562 Pain in left knee: Secondary | ICD-10-CM | POA: Diagnosis not present

## 2015-12-01 DIAGNOSIS — M1712 Unilateral primary osteoarthritis, left knee: Secondary | ICD-10-CM | POA: Diagnosis not present

## 2015-12-01 DIAGNOSIS — G8918 Other acute postprocedural pain: Secondary | ICD-10-CM | POA: Diagnosis not present

## 2015-12-01 DIAGNOSIS — M2342 Loose body in knee, left knee: Secondary | ICD-10-CM | POA: Diagnosis not present

## 2015-12-01 DIAGNOSIS — M94262 Chondromalacia, left knee: Secondary | ICD-10-CM | POA: Diagnosis not present

## 2015-12-01 DIAGNOSIS — M23322 Other meniscus derangements, posterior horn of medial meniscus, left knee: Secondary | ICD-10-CM | POA: Diagnosis not present

## 2015-12-01 DIAGNOSIS — S83232A Complex tear of medial meniscus, current injury, left knee, initial encounter: Secondary | ICD-10-CM | POA: Diagnosis not present

## 2015-12-01 HISTORY — PX: KNEE ARTHROSCOPY: SUR90

## 2015-12-29 NOTE — Progress Notes (Signed)
HPI:  Poor compliance with advised follow up - not seen in 1 year.   HM: -colon ca screening (advised several times, not done - reports he scheduled, then did not feel like drinking the prep so cancelled), hiv (declined), hep c (declined), flu (agreed to do today)  HTN:  -started on lisinopril in 2010  -stable, out of med and needs refill -denies: CP, SOB, swelling   Obesity/Hyperglycemia/Hyperlipidemia: -lifestyle recs -recent L knee meniscus surgery so exercise not good; diet so so  ROS: See pertinent positives and negatives per HPI.  Past Medical History:  Diagnosis Date  . Back pain   . Colon polyps   . Hypertension   . Kidney stones   . Syrinx of spinal cord - on MRI 08/2013, followed by orthopedics 11/30/2013  . Tobacco use disorder 11/13/2013    Past Surgical History:  Procedure Laterality Date  . HERNIA REPAIR    . KNEE ARTHROSCOPY Left 12/01/2015  . TONSILLECTOMY  1967    Family History  Problem Relation Age of Onset  . Hypertension Father   . Kidney disease Father   . Cancer Father 25    colon cancer  . Hypertension Mother     Social History   Social History  . Marital status: Married    Spouse name: N/A  . Number of children: N/A  . Years of education: N/A   Social History Main Topics  . Smoking status: Former Smoker    Packs/day: 1.00    Quit date: 12/21/2013  . Smokeless tobacco: None  . Alcohol use None  . Drug use: Unknown  . Sexual activity: Not Asked   Other Topics Concern  . None   Social History Narrative   Work or School: works for [employer] Situation: lives with wife      Spiritual Beliefs: methodist      Lifestyle: no regular exercise; so so              Current Outpatient Prescriptions:  .  lisinopril (PRINIVIL,ZESTRIL) 40 MG tablet, Take 1 tablet (40 mg total) by mouth daily., Disp: 90 tablet, Rfl: 1  EXAM:  Vitals:   12/30/15 0926  BP: 126/84  Pulse: (!) 54  Temp: 98.8 F  (37.1 C)    Body mass index is 32.76 kg/m.  GENERAL: vitals reviewed and listed above, alert, oriented, appears well hydrated and in no acute distress  HEENT: atraumatic, conjunttiva clear, no obvious abnormalities on inspection of external nose and ears  NECK: no obvious masses on inspection  LUNGS: clear to auscultation bilaterally, no wheezes, rales or rhonchi, good air movement  CV: HRRR, no peripheral edema  MS: moves all extremities without noticeable abnormality  PSYCH: pleasant and cooperative, no obvious depression or anxiety  ASSESSMENT AND PLAN:  Discussed the following assessment and plan:  Essential hypertension, benign - Plan: Basic metabolic panel, CBC (no diff)  Obesity  Hyperglycemia - Plan: Hemoglobin A1c  Hyperlipemia - Plan: Lipid Panel  FH: colon cancer  -labs -flu shot -lisinopril refilled -again, advised colon cancer screening risks/benefits - he has not followed our recommendation in the past - he agrees to schedule colonoscopy -lifestyle recs -physical in 4 months -Patient advised to return or notify a doctor immediately if symptoms worsen or persist or new concerns arise.  Patient Instructions  BEFORE YOU LEAVE: -flu shot -follow up: physical in 4 months - does not have to be fasting -labs  Call your gastroenterologist  today to reschedule your colonoscopy.  We recommend the following healthy lifestyle: 1) Small portions - eat off of salad plate instead of dinner plate 2) Eat a healthy clean diet with avoidance of (less then 1 serving per week) processed foods, sweetened drinks, white starches, red meat, fast foods and sweets and consisting of: * 5-9 servings per day of fresh or frozen fruits and vegetables (not corn or potatoes, not dried or canned) *nuts and seeds, beans *olives and olive oil *small portions of lean meats such as fish and white chicken  *small portions of whole grains 3)Get at least 150 minutes of sweaty aerobic  exercise per week 4)reduce stress - counseling, meditation, relaxation to balance other aspects of your life  We have ordered labs or studies at this visit. It can take up to 1-2 weeks for results and processing. IF results require follow up or explanation, we will call you with instructions. Clinically stable results will be released to your [facility]. If you have not heard from [place] or cannot find your results in [facility] in 2 weeks please contact our office at [phone].  If you are not yet signed up for [facility], please consider signing up.            Colin Benton R., DO

## 2015-12-30 ENCOUNTER — Ambulatory Visit (INDEPENDENT_AMBULATORY_CARE_PROVIDER_SITE_OTHER): Payer: BLUE CROSS/BLUE SHIELD | Admitting: Family Medicine

## 2015-12-30 ENCOUNTER — Encounter: Payer: Self-pay | Admitting: Family Medicine

## 2015-12-30 VITALS — BP 126/84 | HR 54 | Temp 98.8°F | Ht 71.0 in | Wt 234.9 lb

## 2015-12-30 DIAGNOSIS — R739 Hyperglycemia, unspecified: Secondary | ICD-10-CM | POA: Diagnosis not present

## 2015-12-30 DIAGNOSIS — I10 Essential (primary) hypertension: Secondary | ICD-10-CM

## 2015-12-30 DIAGNOSIS — E785 Hyperlipidemia, unspecified: Secondary | ICD-10-CM | POA: Diagnosis not present

## 2015-12-30 DIAGNOSIS — Z23 Encounter for immunization: Secondary | ICD-10-CM | POA: Diagnosis not present

## 2015-12-30 DIAGNOSIS — E669 Obesity, unspecified: Secondary | ICD-10-CM | POA: Diagnosis not present

## 2015-12-30 DIAGNOSIS — Z800 Family history of malignant neoplasm of digestive organs: Secondary | ICD-10-CM

## 2015-12-30 LAB — CBC
HCT: 44.7 % (ref 39.0–52.0)
HEMOGLOBIN: 15.3 g/dL (ref 13.0–17.0)
MCHC: 34.2 g/dL (ref 30.0–36.0)
MCV: 90.6 fl (ref 78.0–100.0)
Platelets: 203 10*3/uL (ref 150.0–400.0)
RBC: 4.93 Mil/uL (ref 4.22–5.81)
RDW: 14.2 % (ref 11.5–15.5)
WBC: 5.6 10*3/uL (ref 4.0–10.5)

## 2015-12-30 LAB — BASIC METABOLIC PANEL
BUN: 16 mg/dL (ref 6–23)
CO2: 29 mEq/L (ref 19–32)
Calcium: 9.7 mg/dL (ref 8.4–10.5)
Chloride: 107 mEq/L (ref 96–112)
Creatinine, Ser: 1.07 mg/dL (ref 0.40–1.50)
GFR: 75.87 mL/min (ref >60.00)
Glucose, Bld: 99 mg/dL (ref 70–99)
POTASSIUM: 4.6 meq/L (ref 3.5–5.1)
Sodium: 139 mEq/L (ref 135–145)

## 2015-12-30 LAB — LIPID PANEL
CHOL/HDL RATIO: 5
CHOLESTEROL: 186 mg/dL (ref 0–200)
HDL: 34.4 mg/dL — AB (ref >39.00)
LDL CALC: 132 mg/dL — AB (ref 0–99)
NONHDL: 151.77
Triglycerides: 97 mg/dL (ref 0.0–149.0)
VLDL: 19.4 mg/dL (ref 0.0–40.0)

## 2015-12-30 LAB — HEMOGLOBIN A1C: HEMOGLOBIN A1C: 6.1 % (ref 4.6–6.5)

## 2015-12-30 MED ORDER — LISINOPRIL 40 MG PO TABS: 40.0000 mg | ORAL_TABLET | Freq: Every day | ORAL | 1 refills | Status: DC

## 2015-12-30 NOTE — Progress Notes (Signed)
Pre visit review using our clinic review tool, if applicable. No additional management support is needed unless otherwise documented below in the visit note. 

## 2015-12-30 NOTE — Patient Instructions (Signed)
BEFORE YOU LEAVE: -flu shot -follow up: physical in 4 months - does not have to be fasting -labs  Call your gastroenterologist today to reschedule your colonoscopy.  We recommend the following healthy lifestyle: 1) Small portions - eat off of salad plate instead of dinner plate 2) Eat a healthy clean diet with avoidance of (less then 1 serving per week) processed foods, sweetened drinks, white starches, red meat, fast foods and sweets and consisting of: * 5-9 servings per day of fresh or frozen fruits and vegetables (not corn or potatoes, not dried or canned) *nuts and seeds, beans *olives and olive oil *small portions of lean meats such as fish and white chicken  *small portions of whole grains 3)Get at least 150 minutes of sweaty aerobic exercise per week 4)reduce stress - counseling, meditation, relaxation to balance other aspects of your life  We have ordered labs or studies at this visit. It can take up to 1-2 weeks for results and processing. IF results require follow up or explanation, we will call you with instructions. Clinically stable results will be released to your [facility]. If you have not heard from [place] or cannot find your results in [facility] in 2 weeks please contact our office at [phone].  If you are not yet signed up for [facility], please consider signing up.

## 2016-07-01 ENCOUNTER — Other Ambulatory Visit: Payer: Self-pay | Admitting: Family Medicine

## 2016-07-08 DIAGNOSIS — Z4789 Encounter for other orthopedic aftercare: Secondary | ICD-10-CM | POA: Diagnosis not present

## 2016-07-08 DIAGNOSIS — Z9889 Other specified postprocedural states: Secondary | ICD-10-CM | POA: Diagnosis not present

## 2016-07-08 DIAGNOSIS — M1712 Unilateral primary osteoarthritis, left knee: Secondary | ICD-10-CM | POA: Diagnosis not present

## 2016-07-15 DIAGNOSIS — M1712 Unilateral primary osteoarthritis, left knee: Secondary | ICD-10-CM | POA: Diagnosis not present

## 2016-07-22 DIAGNOSIS — Z0100 Encounter for examination of eyes and vision without abnormal findings: Secondary | ICD-10-CM | POA: Diagnosis not present

## 2016-07-22 DIAGNOSIS — M1712 Unilateral primary osteoarthritis, left knee: Secondary | ICD-10-CM | POA: Diagnosis not present

## 2016-07-29 DIAGNOSIS — M1712 Unilateral primary osteoarthritis, left knee: Secondary | ICD-10-CM | POA: Diagnosis not present

## 2016-08-26 DIAGNOSIS — D224 Melanocytic nevi of scalp and neck: Secondary | ICD-10-CM | POA: Diagnosis not present

## 2016-08-26 DIAGNOSIS — D225 Melanocytic nevi of trunk: Secondary | ICD-10-CM | POA: Diagnosis not present

## 2016-08-26 DIAGNOSIS — L300 Nummular dermatitis: Secondary | ICD-10-CM | POA: Diagnosis not present

## 2016-08-26 DIAGNOSIS — D2261 Melanocytic nevi of right upper limb, including shoulder: Secondary | ICD-10-CM | POA: Diagnosis not present

## 2016-10-02 ENCOUNTER — Other Ambulatory Visit: Payer: Self-pay | Admitting: Family Medicine

## 2016-10-17 NOTE — Progress Notes (Signed)
HPI:  Evan Wallace is a pleasant 56 y.o. here for follow up. Has a history of poor compliance with recommendations and follow up. He has not come in for an appointment in almost 1 year. Chronic medical problems summarized below were reviewed and changes are noted as needed.He reports he is taking his blood pressure medication. Feels BP up today due to dietary indiscretion last night. Declined most preventive measures we advised at last visit. Still has not done his colon ca screening - reports this is due to schedule conflicts.Denies CP, HA, SOB, DOE, treatment intolerance or new symptoms. Due for colon ca screening, labs, hep c and hiv screening.  HTN:  -started on lisinopril in 2010  -denies: CP, SOB, swelling   Obesity/Hyperglycemia/Hyperlipidemia: -reports being very active in garden and diet "could be better"   ROS: See pertinent positives and negatives per HPI.  Past Medical History:  Diagnosis Date  . Back pain   . Colon polyps   . Hypertension   . Kidney stones   . Syrinx of spinal cord - on MRI 08/2013, followed by orthopedics 11/30/2013  . Tobacco use disorder 11/13/2013    Past Surgical History:  Procedure Laterality Date  . HERNIA REPAIR    . KNEE ARTHROSCOPY Left 12/01/2015  . TONSILLECTOMY  1967    Family History  Problem Relation Age of Onset  . Hypertension Father   . Kidney disease Father   . Cancer Father 58       colon cancer  . Hypertension Mother     Social History   Social History  . Marital status: Married    Spouse name: N/A  . Number of children: N/A  . Years of education: N/A   Social History Main Topics  . Smoking status: Former Smoker    Packs/day: 1.00    Quit date: 12/21/2013  . Smokeless tobacco: Never Used  . Alcohol use No  . Drug use: Unknown  . Sexual activity: Not Asked   Other Topics Concern  . None   Social History Narrative   Work or School: works for [employer] Situation: lives with  wife      Spiritual Beliefs: methodist      Lifestyle: no regular exercise; so so              Current Outpatient Prescriptions:  .  lisinopril (PRINIVIL,ZESTRIL) 40 MG tablet, TAKE ONE TABLET BY MOUTH ONE TIME DAILY (NEEDS APPOINTMENT), Disp: 30 tablet, Rfl: 0 .  triamcinolone cream (KENALOG) 0.1 %, , Disp: , Rfl:  .  amLODipine (NORVASC) 5 MG tablet, Take 1 tablet (5 mg total) by mouth daily., Disp: 30 tablet, Rfl: 3  EXAM:  Vitals:   10/18/16 0832  BP: (!) 154/100  Temp: 98.3 F (36.8 C)    Body mass index is 32.36 kg/m.  GENERAL: vitals reviewed and listed above, alert, oriented, appears well hydrated and in no acute distress  HEENT: atraumatic, conjunttiva clear, no obvious abnormalities on inspection of external nose and ears  NECK: no obvious masses on inspection  LUNGS: clear to auscultation bilaterally, no wheezes, rales or rhonchi, good air movement  CV: HRRR, no peripheral edema  MS: moves all extremities without noticeable abnormality  PSYCH: pleasant and cooperative, no obvious depression or anxiety  ASSESSMENT AND PLAN:  Discussed the following assessment and plan:  Essential hypertension, benign - Plan: Basic metabolic panel, CBC  Hyperlipidemia, unspecified hyperlipidemia type - Plan: Lipid panel  BMI 32.0-32.9,adult  Hyperglycemia - Plan: Hemoglobin A1c  FH: colon cancer  Encounter for hepatitis C virus screening test for high risk patient - Plan: Hepatitis C antibody  -stress importance of preventive care, healthy diet and regular exercise -offered help in setting up his past due colon cancer screening follow up - he chooses to do this himself and promises me he will set this up - understands risks of poor follow up given fh and personal hx -discussed options for treatment of elevated BP - he initially is reluctant to tx - agrees to work on diet and add norvasc, advised 1 month f/u -labs today -Patient advised to return or notify a  doctor immediately if symptoms worsen or persist or new concerns arise.  Patient Instructions  BEFORE YOU LEAVE: -follow up: 1 month for hypertension -labs  START the norvasc and take once daily in addition to the lisinopril for the blood pressure.  Call your gastroenterologist today to schedule your colonoscopy.  We have ordered labs or studies at this visit. It can take up to 1-2 weeks for results and processing. IF results require follow up or explanation, we will call you with instructions. Clinically stable results will be released to your [facility]. If you have not heard from [place] or cannot find your results in [facility] in 2 weeks please contact our office at [phone].  If you are not yet signed up for [facility], please consider signing up.   We recommend the following healthy lifestyle for LIFE: 1) Small portions.   Tip: eat off of a salad plate instead of a dinner plate.  Tip: It is ok to feel hungry after a meal - that likely means you ate an appropriate portion.  Tip: if you need more or a snack choose fruits, veggies and/or a handful of nuts or seeds.  2) Eat a healthy clean diet.  * Tip: Avoid (less then 1 serving per week): processed foods, sweets, sweetened drinks, white starches (rice, flour, bread, potatoes, pasta, etc), red meat, fast foods, butter  *Tip: CHOOSE instead   * 5-9 servings per day of fresh or frozen fruits and vegetables (but not corn, potatoes, bananas, canned or dried fruit)   *nuts and seeds, beans   *olives and olive oil   *small portions of lean meats such as fish and white chicken    *small portions of whole grains  3)Get at least 150 minutes of sweaty aerobic exercise per week.  4)Reduce stress - consider counseling, meditation and relaxation to balance other aspects of your life.         Colin Benton R., DO

## 2016-10-18 ENCOUNTER — Ambulatory Visit (INDEPENDENT_AMBULATORY_CARE_PROVIDER_SITE_OTHER): Payer: BLUE CROSS/BLUE SHIELD | Admitting: Family Medicine

## 2016-10-18 ENCOUNTER — Encounter: Payer: Self-pay | Admitting: Family Medicine

## 2016-10-18 VITALS — BP 154/100 | Temp 98.3°F | Wt 232.0 lb

## 2016-10-18 DIAGNOSIS — E785 Hyperlipidemia, unspecified: Secondary | ICD-10-CM

## 2016-10-18 DIAGNOSIS — Z9189 Other specified personal risk factors, not elsewhere classified: Secondary | ICD-10-CM

## 2016-10-18 DIAGNOSIS — I10 Essential (primary) hypertension: Secondary | ICD-10-CM | POA: Diagnosis not present

## 2016-10-18 DIAGNOSIS — Z800 Family history of malignant neoplasm of digestive organs: Secondary | ICD-10-CM

## 2016-10-18 DIAGNOSIS — Z1159 Encounter for screening for other viral diseases: Secondary | ICD-10-CM

## 2016-10-18 DIAGNOSIS — R739 Hyperglycemia, unspecified: Secondary | ICD-10-CM | POA: Diagnosis not present

## 2016-10-18 DIAGNOSIS — Z6832 Body mass index (BMI) 32.0-32.9, adult: Secondary | ICD-10-CM | POA: Diagnosis not present

## 2016-10-18 LAB — LIPID PANEL
CHOL/HDL RATIO: 5
CHOLESTEROL: 177 mg/dL (ref 0–200)
HDL: 36.1 mg/dL — AB (ref >39.00)
LDL Cholesterol: 123 mg/dL — ABNORMAL HIGH (ref 0–99)
NonHDL: 140.77
TRIGLYCERIDES: 89 mg/dL (ref 0.0–149.0)
VLDL: 17.8 mg/dL (ref 0.0–40.0)

## 2016-10-18 LAB — CBC
HCT: 44.9 % (ref 39.0–52.0)
Hemoglobin: 14.7 g/dL (ref 13.0–17.0)
MCHC: 32.8 g/dL (ref 30.0–36.0)
MCV: 92.3 fl (ref 78.0–100.0)
Platelets: 194 10*3/uL (ref 150.0–400.0)
RBC: 4.86 Mil/uL (ref 4.22–5.81)
RDW: 13.5 % (ref 11.5–15.5)
WBC: 4.8 10*3/uL (ref 4.0–10.5)

## 2016-10-18 LAB — BASIC METABOLIC PANEL
BUN: 16 mg/dL (ref 6–23)
CALCIUM: 9.7 mg/dL (ref 8.4–10.5)
CO2: 27 mEq/L (ref 19–32)
CREATININE: 0.93 mg/dL (ref 0.40–1.50)
Chloride: 107 mEq/L (ref 96–112)
GFR: 88.94 mL/min (ref >60.00)
Glucose, Bld: 97 mg/dL (ref 70–99)
POTASSIUM: 4.2 meq/L (ref 3.5–5.1)
Sodium: 141 mEq/L (ref 135–145)

## 2016-10-18 LAB — HEMOGLOBIN A1C: Hgb A1c MFr Bld: 6.2 % (ref 4.6–6.5)

## 2016-10-18 MED ORDER — AMLODIPINE BESYLATE 5 MG PO TABS: 5.0000 mg | ORAL_TABLET | Freq: Every day | ORAL | 3 refills | Status: DC

## 2016-10-18 NOTE — Patient Instructions (Signed)
BEFORE YOU LEAVE: -follow up: 1 month for hypertension -labs  START the norvasc and take once daily in addition to the lisinopril for the blood pressure.  Call your gastroenterologist today to schedule your colonoscopy.  We have ordered labs or studies at this visit. It can take up to 1-2 weeks for results and processing. IF results require follow up or explanation, we will call you with instructions. Clinically stable results will be released to your [facility]. If you have not heard from [place] or cannot find your results in [facility] in 2 weeks please contact our office at [phone].  If you are not yet signed up for [facility], please consider signing up.   We recommend the following healthy lifestyle for LIFE: 1) Small portions.   Tip: eat off of a salad plate instead of a dinner plate.  Tip: It is ok to feel hungry after a meal - that likely means you ate an appropriate portion.  Tip: if you need more or a snack choose fruits, veggies and/or a handful of nuts or seeds.  2) Eat a healthy clean diet.  * Tip: Avoid (less then 1 serving per week): processed foods, sweets, sweetened drinks, white starches (rice, flour, bread, potatoes, pasta, etc), red meat, fast foods, butter  *Tip: CHOOSE instead   * 5-9 servings per day of fresh or frozen fruits and vegetables (but not corn, potatoes, bananas, canned or dried fruit)   *nuts and seeds, beans   *olives and olive oil   *small portions of lean meats such as fish and white chicken    *small portions of whole grains  3)Get at least 150 minutes of sweaty aerobic exercise per week.  4)Reduce stress - consider counseling, meditation and relaxation to balance other aspects of your life.

## 2016-10-19 LAB — HEPATITIS C ANTIBODY: HCV Ab: NEGATIVE

## 2016-10-26 ENCOUNTER — Ambulatory Visit (INDEPENDENT_AMBULATORY_CARE_PROVIDER_SITE_OTHER): Payer: BLUE CROSS/BLUE SHIELD | Admitting: Family Medicine

## 2016-10-26 ENCOUNTER — Encounter: Payer: Self-pay | Admitting: *Deleted

## 2016-10-26 ENCOUNTER — Encounter: Payer: Self-pay | Admitting: Family Medicine

## 2016-10-26 VITALS — BP 102/78 | HR 88 | Temp 98.3°F | Ht 71.0 in | Wt 226.2 lb

## 2016-10-26 DIAGNOSIS — R197 Diarrhea, unspecified: Secondary | ICD-10-CM

## 2016-10-26 DIAGNOSIS — R112 Nausea with vomiting, unspecified: Secondary | ICD-10-CM

## 2016-10-26 MED ORDER — ONDANSETRON HCL 4 MG PO TABS: 4.0000 mg | ORAL_TABLET | Freq: Three times a day (TID) | ORAL | 0 refills | Status: DC | PRN

## 2016-10-26 NOTE — Patient Instructions (Signed)
BEFORE YOU LEAVE: -work note, seen today, out until 24 hours free of vomiting and diarrhea  Drink plenty of fluids (if not eating soup broth or gatorade for electrolytes is good) - otherwise water is best.  Do not eat dairy for 1 week.  Nexium (over the counter dose) once daily for 5-7 days.  Can use the zofran if needed for nausea.  I hope you are feeling better soon! Seek care immediately if worsening, new concerns or you are not improving with treatment.     WE NOW OFFER   [facility] FAST TRACK!!!  SAME DAY Appointments for ACUTE CARE  Such as: Sprains, Injuries, cuts, abrasions, rashes, muscle pain, joint pain, back pain Colds, flu, sore throats, headache, allergies, cough, fever  Ear pain, sinus and eye infections Abdominal pain, nausea, vomiting, diarrhea, upset stomach Animal/insect bites  3 Easy Ways to Schedule: Walk-In Scheduling Call in scheduling Mychart Sign-up: [email or web address]

## 2016-10-26 NOTE — Progress Notes (Signed)
HPI:  Acute visit for N/V/D: -started acutely 3 days ago -had nausea, vomiting and watery diarrhea x 24 hours -no fevers, melena, hematochezia, hematemesis -vomiting resolved, diarrhea much better - one episode in last 24 hours -still with nausea and belching at times -feels he doesn't want to eat as is nauseous -still taking both BP meds - reports BP normal at home, low on cna check, normal to higher on DBP on my check -no recent abx, travel or tick bite  ROS: See pertinent positives and negatives per HPI.  Past Medical History:  Diagnosis Date  . Back pain   . Colon polyps   . Hypertension   . Kidney stones   . Syrinx of spinal cord - on MRI 08/2013, followed by orthopedics 11/30/2013  . Tobacco use disorder 11/13/2013    Past Surgical History:  Procedure Laterality Date  . HERNIA REPAIR    . KNEE ARTHROSCOPY Left 12/01/2015  . TONSILLECTOMY  1967    Family History  Problem Relation Age of Onset  . Hypertension Father   . Kidney disease Father   . Cancer Father 38       colon cancer  . Hypertension Mother     Social History   Social History  . Marital status: Married    Spouse name: N/A  . Number of children: N/A  . Years of education: N/A   Social History Main Topics  . Smoking status: Former Smoker    Packs/day: 1.00    Quit date: 12/21/2013  . Smokeless tobacco: Never Used  . Alcohol use No  . Drug use: Unknown  . Sexual activity: Not Asked   Other Topics Concern  . None   Social History Narrative   Work or School: works for [employer] Situation: lives with wife      Spiritual Beliefs: methodist      Lifestyle: no regular exercise; so so              Current Outpatient Prescriptions:  .  amLODipine (NORVASC) 5 MG tablet, Take 1 tablet (5 mg total) by mouth daily., Disp: 30 tablet, Rfl: 3 .  lisinopril (PRINIVIL,ZESTRIL) 40 MG tablet, TAKE ONE TABLET BY MOUTH ONE TIME DAILY (NEEDS APPOINTMENT), Disp: 30 tablet,  Rfl: 0 .  triamcinolone cream (KENALOG) 0.1 %, , Disp: , Rfl:  .  ondansetron (ZOFRAN) 4 MG tablet, Take 1 tablet (4 mg total) by mouth every 8 (eight) hours as needed for nausea or vomiting., Disp: 10 tablet, Rfl: 0  EXAM:  Vitals:   10/26/16 1254 10/26/16 1300  BP: 122/88 102/78  Pulse: 88   Temp: 98.3 F (36.8 C)     Body mass index is 31.55 kg/m.  GENERAL: vitals reviewed and listed above, alert, oriented, appears well hydrated and in no acute distress  HEENT: atraumatic, conjunttiva clear, no obvious abnormalities on inspection of external nose and ears  NECK: no obvious masses on inspection  LUNGS: clear to auscultation bilaterally, no wheezes, rales or rhonchi, good air movement  CV: HRRR, no peripheral edema  ABD: BS+, soft, NTTP, no rebound or guarding  MS: moves all extremities without noticeable abnormality  PSYCH: pleasant and cooperative, no obvious depression or anxiety  ASSESSMENT AND PLAN:  Discussed the following assessment and plan:  Nausea and vomiting, intractability of vomiting not specified, unspecified vomiting type  Diarrhea, unspecified type  -we discussed possible serious and likely etiologies, workup and treatment, treatment risks and return  precautions - suspect gastroenteritis and seems to be improving dramatically -after this discussion, Kein opted for oral hydration, hold bp meds if not eating, no dairy, zofran if needed, nexium for a few days, work note -follow up advised as needed -of course, we advised Kyvon  to return or notify a doctor immediately if symptoms worsen or persist or new concerns arise.   Patient Instructions  BEFORE YOU LEAVE: -work note, seen today, out until 24 hours free of vomiting and diarrhea  Drink plenty of fluids (if not eating soup broth or gatorade for electrolytes is good) - otherwise water is best.  Do not eat dairy for 1 week.  Nexium (over the counter dose) once daily for 5-7 days.  Can use the  zofran if needed for nausea.  I hope you are feeling better soon! Seek care immediately if worsening, new concerns or you are not improving with treatment.     WE NOW OFFER   [facility] FAST TRACK!!!  SAME DAY Appointments for ACUTE CARE  Such as: Sprains, Injuries, cuts, abrasions, rashes, muscle pain, joint pain, back pain Colds, flu, sore throats, headache, allergies, cough, fever  Ear pain, sinus and eye infections Abdominal pain, nausea, vomiting, diarrhea, upset stomach Animal/insect bites  3 Easy Ways to Schedule: Walk-In Scheduling Call in scheduling Mychart Sign-up: [email or web address]           Colin Benton R., DO

## 2016-11-04 ENCOUNTER — Other Ambulatory Visit: Payer: Self-pay | Admitting: Family Medicine

## 2016-11-18 ENCOUNTER — Ambulatory Visit: Payer: BLUE CROSS/BLUE SHIELD | Admitting: Family Medicine

## 2016-11-24 DIAGNOSIS — R739 Hyperglycemia, unspecified: Secondary | ICD-10-CM | POA: Insufficient documentation

## 2016-11-24 NOTE — Progress Notes (Deleted)
  HPI:  Follow up up HTN - also mild hyperlipidemia and prediabetes on recent labs: Added norvasc in June. Reports. Denies. Due for colonoscopy. Sees Dr. Earlean Shawl. Advised to schedule last month.  ROS: See pertinent positives and negatives per HPI.  Past Medical History:  Diagnosis Date  . Back pain   . Colon polyps   . Hypertension   . Kidney stones   . Syrinx of spinal cord - on MRI 08/2013, followed by orthopedics 11/30/2013  . Tobacco use disorder 11/13/2013    Past Surgical History:  Procedure Laterality Date  . HERNIA REPAIR    . KNEE ARTHROSCOPY Left 12/01/2015  . TONSILLECTOMY  1967    Family History  Problem Relation Age of Onset  . Hypertension Father   . Kidney disease Father   . Cancer Father 78       colon cancer  . Hypertension Mother     Social History   Social History  . Marital status: Married    Spouse name: N/A  . Number of children: N/A  . Years of education: N/A   Social History Main Topics  . Smoking status: Former Smoker    Packs/day: 1.00    Quit date: 12/21/2013  . Smokeless tobacco: Never Used  . Alcohol use No  . Drug use: Unknown  . Sexual activity: Not on file   Other Topics Concern  . Not on file   Social History Narrative   Work or School: works for [employer] Situation: lives with wife      Spiritual Beliefs: methodist      Lifestyle: no regular exercise; so so              Current Outpatient Prescriptions:  .  amLODipine (NORVASC) 5 MG tablet, Take 1 tablet (5 mg total) by mouth daily., Disp: 30 tablet, Rfl: 3 .  lisinopril (PRINIVIL,ZESTRIL) 40 MG tablet, TAKE ONE TABLET BY MOUTH DAILY (Needs appointment), Disp: 30 tablet, Rfl: 0 .  ondansetron (ZOFRAN) 4 MG tablet, Take 1 tablet (4 mg total) by mouth every 8 (eight) hours as needed for nausea or vomiting., Disp: 10 tablet, Rfl: 0 .  triamcinolone cream (KENALOG) 0.1 %, , Disp: , Rfl:   EXAM:  There were no vitals filed for this  visit.  There is no height or weight on file to calculate BMI.  GENERAL: vitals reviewed and listed above, alert, oriented, appears well hydrated and in no acute distress  HEENT: atraumatic, conjunttiva clear, no obvious abnormalities on inspection of external nose and ears  NECK: no obvious masses on inspection  LUNGS: clear to auscultation bilaterally, no wheezes, rales or rhonchi, good air movement  CV: HRRR, no peripheral edema  MS: moves all extremities without noticeable abnormality  PSYCH: pleasant and cooperative, no obvious depression or anxiety  ASSESSMENT AND PLAN:  Discussed the following assessment and plan:  No diagnosis found.  -Patient advised to return or notify a doctor immediately if symptoms worsen or persist or new concerns arise.  There are no Patient Instructions on file for this visit.  Colin Benton R., DO

## 2016-11-25 ENCOUNTER — Ambulatory Visit: Payer: BLUE CROSS/BLUE SHIELD | Admitting: Family Medicine

## 2016-11-29 ENCOUNTER — Telehealth: Payer: Self-pay | Admitting: Family Medicine

## 2016-11-29 MED ORDER — LISINOPRIL 40 MG PO TABS: ORAL_TABLET | ORAL | 1 refills | Status: DC

## 2016-11-29 MED ORDER — AMLODIPINE BESYLATE 5 MG PO TABS: 5.0000 mg | ORAL_TABLET | Freq: Every day | ORAL | 1 refills | Status: DC

## 2016-11-29 NOTE — Telephone Encounter (Signed)
Pt need new Rx for lisinopril #30 and amlodipine #30  Pharm:  [facility], [place].  Pt has been having dental surgery and will call back to schedule Bp appointment.

## 2016-11-29 NOTE — Telephone Encounter (Signed)
Rx done. 

## 2016-12-08 NOTE — Progress Notes (Signed)
HPI:  Follow up HTN, prediabetes, Hyperlipidemia (ratio 5 6/18), Obesity. He reports he is tolerating the Norvasc well and is taking this and the lisinopril daily. However, he has been checking his blood pressure at home and it continues to run higher than he would like, around 150-160 on the top and in the 80s on the bottom. He occasionally feels like he has some pressure in his head and he thinks this is related to the blood pressure. He reports he is very active at work and in the garden and does not have any chest pain, shortness of breath, swelling or dyspnea on exertion. He feels like his diet is pretty healthy, but he does drink sweetened tea every day. He reports he finally has his colonoscopy scheduled.  HTN: -meds: added norvasc 10/2016, lisinopril -he wanted to work on lifestyle/wt reduction 10/2016  Prediabetes/HLD/Obesity: -hgba1c 6.2 and chol ratio 5 10/2016 -wt 226 (10/2016) --> 227  ROS: See pertinent positives and negatives per HPI.  Past Medical History:  Diagnosis Date  . Back pain   . Colon polyps   . Hypertension   . Kidney stones   . Syrinx of spinal cord - on MRI 08/2013, followed by orthopedics 11/30/2013  . Tobacco use disorder 11/13/2013    Past Surgical History:  Procedure Laterality Date  . HERNIA REPAIR    . KNEE ARTHROSCOPY Left 12/01/2015  . TONSILLECTOMY  1967    Family History  Problem Relation Age of Onset  . Hypertension Father   . Kidney disease Father   . Cancer Father 3       colon cancer  . Hypertension Mother     Social History   Social History  . Marital status: Married    Spouse name: N/A  . Number of children: N/A  . Years of education: N/A   Social History Main Topics  . Smoking status: Former Smoker    Packs/day: 1.00    Quit date: 12/21/2013  . Smokeless tobacco: Never Used  . Alcohol use No  . Drug use: Unknown  . Sexual activity: Not Asked   Other Topics Concern  . None   Social History Narrative   Work or  School: works for [employer] Situation: lives with wife      Spiritual Beliefs: methodist      Lifestyle: no regular exercise; so so              Current Outpatient Prescriptions:  .  amLODipine (NORVASC) 5 MG tablet, Take 1.5 tablets (7.5 mg total) by mouth daily., Disp: 45 tablet, Rfl: 1 .  lisinopril (PRINIVIL,ZESTRIL) 40 MG tablet, TAKE ONE TABLET BY MOUTH DAILY (Needs appointment), Disp: 30 tablet, Rfl: 1 .  triamcinolone cream (KENALOG) 0.1 %, , Disp: , Rfl:   EXAM:  Vitals:   12/09/16 0703 12/09/16 0714  BP: (!) 142/86 140/88  Pulse: 60   Temp: 98.5 F (36.9 C)     Body mass index is 31.73 kg/m.  GENERAL: vitals reviewed and listed above, alert, oriented, appears well hydrated and in no acute distress  HEENT: atraumatic, conjunttiva clear, no obvious abnormalities on inspection of external nose and ears  NECK: no obvious masses on inspection  LUNGS: clear to auscultation bilaterally, no wheezes, rales or rhonchi, good air movement  CV: HRRR, no peripheral edema  MS: moves all extremities without noticeable abnormality  PSYCH: pleasant and cooperative, no obvious depression or anxiety  ASSESSMENT AND PLAN:  Discussed the following assessment and plan:  Essential hypertension, benign  Class 1 obesity due to excess calories with serious comorbidity and body mass index (BMI) of 31.0 to 31.9 in adult  Hyperlipidemia, unspecified hyperlipidemia type  Hyperglycemia  Syrinx of spinal cord ([place]), Chronic  -Discussed options for blood pressure management including lifestyle changes and medications -Opted to increase the Norvasc and follow up in several weeks -Also encouraged cutting back on the sweetened beverages, consideration of using Stevia as sweeteners needed and increasing aerobic exercise -Patient advised to return or notify a doctor immediately if symptoms worsen or persist or new concerns arise.  Patient Instructions   BEFORE YOU LEAVE: -follow up: In 2-4 weeks  Increase the Norvasc to 7.5 mg daily. Continue the lisinopril.  Try to cut down on sugar consumption and increase aerobic exercise.   We recommend the following healthy lifestyle for LIFE: 1) Small portions.   Tip: eat off of a salad plate instead of a dinner plate.  Tip: It is ok to feel hungry after a meal - that likely means you ate an appropriate portion.  Tip: if you need more or a snack choose fruits, veggies and/or a handful of nuts or seeds.  2) Eat a healthy clean diet.   TRY TO EAT: -at least 5-7 servings of low sugar vegetables per day (not corn, potatoes or bananas.) -berries are the best choice if you wish to eat fruit.   -lean meets (fish, chicken or [place] breasts) -vegan proteins for some meals - beans or tofu, whole grains, nuts and seeds -Replace bad fats with good fats - good fats include: fish, nuts and seeds, canola oil, olive oil -small amounts of low fat or non fat dairy -small amounts of100 % whole grains - check the lables  AVOID: -SUGAR, sweets, anything with added sugar, corn syrup or sweeteners -if you must have a sweetener, small amounts of stevia may be best -sweetened beverages -simple starches (rice, bread, potatoes, pasta, chips, etc - small amounts of 100% whole grains are ok) -red meat, pork, butter -fried foods, fast food, processed food, excessive dairy, eggs and coconut.  3)Get at least 150 minutes of sweaty aerobic exercise per week.  4)Reduce stress - consider counseling, meditation and relaxation to balance other aspects of your life.     Colin Benton R., DO

## 2016-12-09 ENCOUNTER — Ambulatory Visit (INDEPENDENT_AMBULATORY_CARE_PROVIDER_SITE_OTHER): Payer: BLUE CROSS/BLUE SHIELD | Admitting: Family Medicine

## 2016-12-09 ENCOUNTER — Encounter: Payer: Self-pay | Admitting: Family Medicine

## 2016-12-09 VITALS — BP 140/88 | HR 60 | Temp 98.5°F | Ht 71.0 in | Wt 227.5 lb

## 2016-12-09 DIAGNOSIS — Z6831 Body mass index (BMI) 31.0-31.9, adult: Secondary | ICD-10-CM

## 2016-12-09 DIAGNOSIS — E785 Hyperlipidemia, unspecified: Secondary | ICD-10-CM | POA: Diagnosis not present

## 2016-12-09 DIAGNOSIS — E6609 Other obesity due to excess calories: Secondary | ICD-10-CM

## 2016-12-09 DIAGNOSIS — G950 Syringomyelia and syringobulbia: Secondary | ICD-10-CM | POA: Diagnosis not present

## 2016-12-09 DIAGNOSIS — I10 Essential (primary) hypertension: Secondary | ICD-10-CM

## 2016-12-09 DIAGNOSIS — R739 Hyperglycemia, unspecified: Secondary | ICD-10-CM

## 2016-12-09 MED ORDER — AMLODIPINE BESYLATE 5 MG PO TABS: 7.5000 mg | ORAL_TABLET | Freq: Every day | ORAL | 1 refills | Status: DC

## 2016-12-09 NOTE — Patient Instructions (Signed)
BEFORE YOU LEAVE: -follow up: In 2-4 weeks  Increase the Norvasc to 7.5 mg daily. Continue the lisinopril.  Try to cut down on sugar consumption and increase aerobic exercise.   We recommend the following healthy lifestyle for LIFE: 1) Small portions.   Tip: eat off of a salad plate instead of a dinner plate.  Tip: It is ok to feel hungry after a meal - that likely means you ate an appropriate portion.  Tip: if you need more or a snack choose fruits, veggies and/or a handful of nuts or seeds.  2) Eat a healthy clean diet.   TRY TO EAT: -at least 5-7 servings of low sugar vegetables per day (not corn, potatoes or bananas.) -berries are the best choice if you wish to eat fruit.   -lean meets (fish, chicken or [place] breasts) -vegan proteins for some meals - beans or tofu, whole grains, nuts and seeds -Replace bad fats with good fats - good fats include: fish, nuts and seeds, canola oil, olive oil -small amounts of low fat or non fat dairy -small amounts of100 % whole grains - check the lables  AVOID: -SUGAR, sweets, anything with added sugar, corn syrup or sweeteners -if you must have a sweetener, small amounts of stevia may be best -sweetened beverages -simple starches (rice, bread, potatoes, pasta, chips, etc - small amounts of 100% whole grains are ok) -red meat, pork, butter -fried foods, fast food, processed food, excessive dairy, eggs and coconut.  3)Get at least 150 minutes of sweaty aerobic exercise per week.  4)Reduce stress - consider counseling, meditation and relaxation to balance other aspects of your life.

## 2016-12-21 NOTE — Progress Notes (Signed)
HPI:    HTN: -meds: added norvasc 10/2016, lisinopril -he wanted to work on lifestyle/wt reduction 10/2016 -increased Norvasc to 7.5 mg 12/2016 -reports home BPs in the 120-130s/70s - brought cuff today and accurate -denies CP, SOB, DOE, HAs  Prediabetes/HLD/Obesity: -hgba1c 6.2 and chol ratio 5 10/2016 -wt 226 (10/2016) --> 227 (12/09/16) --> 227  ROS: See pertinent positives and negatives per HPI.  Past Medical History:  Diagnosis Date  . Back pain   . Colon polyps   . Hypertension   . Kidney stones   . Syrinx of spinal cord - on MRI 08/2013, followed by orthopedics 11/30/2013  . Tobacco use disorder 11/13/2013    Past Surgical History:  Procedure Laterality Date  . HERNIA REPAIR    . KNEE ARTHROSCOPY Left 12/01/2015  . TONSILLECTOMY  1967    Family History  Problem Relation Age of Onset  . Hypertension Father   . Kidney disease Father   . Cancer Father 95       colon cancer  . Hypertension Mother     Social History   Social History  . Marital status: Married    Spouse name: N/A  . Number of children: N/A  . Years of education: N/A   Social History Main Topics  . Smoking status: Former Smoker    Packs/day: 1.00    Quit date: 12/21/2013  . Smokeless tobacco: Never Used  . Alcohol use No  . Drug use: Unknown  . Sexual activity: Not Asked   Other Topics Concern  . None   Social History Narrative   Work or School: works for [employer] Situation: lives with wife      Spiritual Beliefs: methodist      Lifestyle: no regular exercise; so so              Current Outpatient Prescriptions:  .  amLODipine (NORVASC) 5 MG tablet, Take 1.5 tablets (7.5 mg total) by mouth daily., Disp: 45 tablet, Rfl: 1 .  lisinopril (PRINIVIL,ZESTRIL) 40 MG tablet, TAKE ONE TABLET BY MOUTH DAILY (Needs appointment), Disp: 30 tablet, Rfl: 1 .  triamcinolone cream (KENALOG) 0.1 %, , Disp: , Rfl:   EXAM:  Vitals:   12/23/16 0723  BP: 126/74   Pulse: (!) 59  Temp: 98.3 F (36.8 C)    Body mass index is 31.76 kg/m.  GENERAL: vitals reviewed and listed above, alert, oriented, appears well hydrated and in no acute distress  HEENT: atraumatic, conjunttiva clear, no obvious abnormalities on inspection of external nose and ears  NECK: no obvious masses on inspection  LUNGS: clear to auscultation bilaterally, no wheezes, rales or rhonchi, good air movement  CV: HRRR, no peripheral edema  MS: moves all extremities without noticeable abnormality  PSYCH: pleasant and cooperative, no obvious depression or anxiety  ASSESSMENT AND PLAN:  Discussed the following assessment and plan:  Essential hypertension  -BP looks good on recheck after sitting today and home BP reading acceptable -he opted to stay at current doses -lifestyle recs -follow up in 3-4 months -Patient advised to return or notify a doctor immediately if symptoms worsen or persist or new concerns arise.  Patient Instructions  BEFORE YOU LEAVE: -follow up: in 3 months  Continue the current medications.   Check your blood pressure a few times per week and follow up sooner if running high.  WE NOW OFFER   [facility] FAST TRACK!!!  SAME DAY Appointments for ACUTE CARE  Such as: Sprains, Injuries, cuts, abrasions, rashes, muscle pain, joint pain, back pain Colds, flu, sore throats, headache, allergies, cough, fever  Ear pain, sinus and eye infections Abdominal pain, nausea, vomiting, diarrhea, upset stomach Animal/insect bites  3 Easy Ways to Schedule: Walk-In Scheduling Call in scheduling Mychart Sign-up: [email or web address]            Colin Benton R., DO

## 2016-12-23 ENCOUNTER — Ambulatory Visit (INDEPENDENT_AMBULATORY_CARE_PROVIDER_SITE_OTHER): Payer: BLUE CROSS/BLUE SHIELD | Admitting: Family Medicine

## 2016-12-23 ENCOUNTER — Encounter: Payer: Self-pay | Admitting: Family Medicine

## 2016-12-23 VITALS — BP 126/74 | HR 59 | Temp 98.3°F | Ht 71.0 in | Wt 227.7 lb

## 2016-12-23 DIAGNOSIS — I10 Essential (primary) hypertension: Secondary | ICD-10-CM

## 2016-12-23 NOTE — Patient Instructions (Signed)
BEFORE YOU LEAVE: -follow up: in 3 months  Continue the current medications.   Check your blood pressure a few times per week and follow up sooner if running high.  WE NOW OFFER   [facility] FAST TRACK!!!  SAME DAY Appointments for ACUTE CARE  Such as: Sprains, Injuries, cuts, abrasions, rashes, muscle pain, joint pain, back pain Colds, flu, sore throats, headache, allergies, cough, fever  Ear pain, sinus and eye infections Abdominal pain, nausea, vomiting, diarrhea, upset stomach Animal/insect bites  3 Easy Ways to Schedule: Walk-In Scheduling Call in scheduling Mychart Sign-up: [email or web address]

## 2017-01-04 ENCOUNTER — Other Ambulatory Visit: Payer: Self-pay | Admitting: *Deleted

## 2017-01-04 MED ORDER — LISINOPRIL 40 MG PO TABS: ORAL_TABLET | ORAL | 1 refills | Status: DC

## 2017-01-04 MED ORDER — AMLODIPINE BESYLATE 5 MG PO TABS: 7.5000 mg | ORAL_TABLET | Freq: Every day | ORAL | 1 refills | Status: DC

## 2017-01-04 NOTE — Telephone Encounter (Signed)
Rx done. 

## 2017-01-20 ENCOUNTER — Encounter: Payer: Self-pay | Admitting: Family Medicine

## 2017-01-25 DIAGNOSIS — K648 Other hemorrhoids: Secondary | ICD-10-CM | POA: Diagnosis not present

## 2017-01-25 DIAGNOSIS — Z800 Family history of malignant neoplasm of digestive organs: Secondary | ICD-10-CM | POA: Diagnosis not present

## 2017-01-25 DIAGNOSIS — Z86010 Personal history of colonic polyps: Secondary | ICD-10-CM | POA: Diagnosis not present

## 2017-01-25 DIAGNOSIS — Z1211 Encounter for screening for malignant neoplasm of colon: Secondary | ICD-10-CM | POA: Diagnosis not present

## 2017-01-25 DIAGNOSIS — K5730 Diverticulosis of large intestine without perforation or abscess without bleeding: Secondary | ICD-10-CM | POA: Diagnosis not present

## 2017-01-25 LAB — HM COLONOSCOPY

## 2017-02-14 ENCOUNTER — Encounter: Payer: Self-pay | Admitting: Family Medicine

## 2017-03-31 ENCOUNTER — Ambulatory Visit: Payer: BLUE CROSS/BLUE SHIELD | Admitting: Family Medicine

## 2017-04-06 DIAGNOSIS — K648 Other hemorrhoids: Secondary | ICD-10-CM | POA: Diagnosis not present

## 2017-04-20 DIAGNOSIS — K648 Other hemorrhoids: Secondary | ICD-10-CM | POA: Diagnosis not present

## 2017-05-04 DIAGNOSIS — K648 Other hemorrhoids: Secondary | ICD-10-CM | POA: Diagnosis not present

## 2017-05-04 LAB — HM COLONOSCOPY

## 2017-05-12 ENCOUNTER — Encounter: Payer: Self-pay | Admitting: Family Medicine

## 2017-08-04 DIAGNOSIS — Z0000 Encounter for general adult medical examination without abnormal findings: Secondary | ICD-10-CM | POA: Diagnosis not present

## 2017-08-04 DIAGNOSIS — I10 Essential (primary) hypertension: Secondary | ICD-10-CM | POA: Diagnosis not present

## 2017-08-04 DIAGNOSIS — Z1389 Encounter for screening for other disorder: Secondary | ICD-10-CM | POA: Diagnosis not present

## 2017-08-04 DIAGNOSIS — Z1159 Encounter for screening for other viral diseases: Secondary | ICD-10-CM | POA: Diagnosis not present

## 2017-08-04 DIAGNOSIS — E669 Obesity, unspecified: Secondary | ICD-10-CM | POA: Diagnosis not present

## 2017-09-15 DIAGNOSIS — E669 Obesity, unspecified: Secondary | ICD-10-CM | POA: Diagnosis not present

## 2017-09-15 DIAGNOSIS — R7303 Prediabetes: Secondary | ICD-10-CM | POA: Diagnosis not present

## 2017-09-15 DIAGNOSIS — K760 Fatty (change of) liver, not elsewhere classified: Secondary | ICD-10-CM | POA: Diagnosis not present

## 2017-09-15 DIAGNOSIS — E785 Hyperlipidemia, unspecified: Secondary | ICD-10-CM | POA: Diagnosis not present

## 2017-09-20 ENCOUNTER — Other Ambulatory Visit: Payer: Self-pay | Admitting: Family Medicine

## 2017-09-20 DIAGNOSIS — K760 Fatty (change of) liver, not elsewhere classified: Secondary | ICD-10-CM

## 2017-09-29 ENCOUNTER — Ambulatory Visit (HOSPITAL_COMMUNITY)
Admission: RE | Admit: 2017-09-29 | Discharge: 2017-09-29 | Disposition: A | Discharge status: Home / Self Care | Payer: BLUE CROSS/BLUE SHIELD | Source: Ambulatory Visit | Attending: Family Medicine | Admitting: Family Medicine

## 2017-09-29 DIAGNOSIS — K760 Fatty (change of) liver, not elsewhere classified: Secondary | ICD-10-CM

## 2017-11-08 ENCOUNTER — Encounter: Payer: Self-pay | Admitting: Family Medicine

## 2017-12-29 DIAGNOSIS — M25572 Pain in left ankle and joints of left foot: Secondary | ICD-10-CM | POA: Diagnosis not present

## 2018-01-26 DIAGNOSIS — M25572 Pain in left ankle and joints of left foot: Secondary | ICD-10-CM | POA: Diagnosis not present

## 2018-02-21 DIAGNOSIS — M25572 Pain in left ankle and joints of left foot: Secondary | ICD-10-CM | POA: Diagnosis not present

## 2018-07-06 DIAGNOSIS — E785 Hyperlipidemia, unspecified: Secondary | ICD-10-CM | POA: Diagnosis not present

## 2018-07-06 DIAGNOSIS — K760 Fatty (change of) liver, not elsewhere classified: Secondary | ICD-10-CM | POA: Diagnosis not present

## 2018-07-06 DIAGNOSIS — R7303 Prediabetes: Secondary | ICD-10-CM | POA: Diagnosis not present

## 2018-07-06 DIAGNOSIS — I10 Essential (primary) hypertension: Secondary | ICD-10-CM | POA: Diagnosis not present

## 2018-07-20 DIAGNOSIS — H5213 Myopia, bilateral: Secondary | ICD-10-CM | POA: Diagnosis not present

## 2018-08-22 DIAGNOSIS — M25511 Pain in right shoulder: Secondary | ICD-10-CM | POA: Diagnosis not present

## 2018-08-31 DIAGNOSIS — M25511 Pain in right shoulder: Secondary | ICD-10-CM | POA: Diagnosis not present

## 2018-09-07 DIAGNOSIS — M25511 Pain in right shoulder: Secondary | ICD-10-CM | POA: Diagnosis not present

## 2018-09-13 DIAGNOSIS — S46011D Strain of muscle(s) and tendon(s) of the rotator cuff of right shoulder, subsequent encounter: Secondary | ICD-10-CM | POA: Diagnosis not present

## 2018-09-13 DIAGNOSIS — M25511 Pain in right shoulder: Secondary | ICD-10-CM | POA: Diagnosis not present

## 2018-09-29 DIAGNOSIS — Y999 Unspecified external cause status: Secondary | ICD-10-CM | POA: Diagnosis not present

## 2018-09-29 DIAGNOSIS — Z4889 Encounter for other specified surgical aftercare: Secondary | ICD-10-CM | POA: Diagnosis not present

## 2018-09-29 DIAGNOSIS — M7521 Bicipital tendinitis, right shoulder: Secondary | ICD-10-CM | POA: Diagnosis not present

## 2018-09-29 DIAGNOSIS — M25511 Pain in right shoulder: Secondary | ICD-10-CM | POA: Diagnosis not present

## 2018-09-29 DIAGNOSIS — X58XXXA Exposure to other specified factors, initial encounter: Secondary | ICD-10-CM | POA: Diagnosis not present

## 2018-09-29 DIAGNOSIS — M7541 Impingement syndrome of right shoulder: Secondary | ICD-10-CM | POA: Diagnosis not present

## 2018-09-29 DIAGNOSIS — M75121 Complete rotator cuff tear or rupture of right shoulder, not specified as traumatic: Secondary | ICD-10-CM | POA: Diagnosis not present

## 2018-09-29 DIAGNOSIS — G8918 Other acute postprocedural pain: Secondary | ICD-10-CM | POA: Diagnosis not present

## 2018-09-29 DIAGNOSIS — M19011 Primary osteoarthritis, right shoulder: Secondary | ICD-10-CM | POA: Diagnosis not present

## 2018-09-29 DIAGNOSIS — S46011D Strain of muscle(s) and tendon(s) of the rotator cuff of right shoulder, subsequent encounter: Secondary | ICD-10-CM | POA: Diagnosis not present

## 2018-09-29 DIAGNOSIS — I890 Lymphedema, not elsewhere classified: Secondary | ICD-10-CM | POA: Diagnosis not present

## 2018-09-29 DIAGNOSIS — S46011A Strain of muscle(s) and tendon(s) of the rotator cuff of right shoulder, initial encounter: Secondary | ICD-10-CM | POA: Diagnosis not present

## 2018-10-10 DIAGNOSIS — M25611 Stiffness of right shoulder, not elsewhere classified: Secondary | ICD-10-CM | POA: Diagnosis not present

## 2018-10-10 DIAGNOSIS — S46011D Strain of muscle(s) and tendon(s) of the rotator cuff of right shoulder, subsequent encounter: Secondary | ICD-10-CM | POA: Diagnosis not present

## 2018-10-10 DIAGNOSIS — Z4889 Encounter for other specified surgical aftercare: Secondary | ICD-10-CM | POA: Diagnosis not present

## 2018-10-10 DIAGNOSIS — M25511 Pain in right shoulder: Secondary | ICD-10-CM | POA: Diagnosis not present

## 2018-10-10 DIAGNOSIS — M6281 Muscle weakness (generalized): Secondary | ICD-10-CM | POA: Diagnosis not present

## 2018-10-10 DIAGNOSIS — I890 Lymphedema, not elsewhere classified: Secondary | ICD-10-CM | POA: Diagnosis not present

## 2018-10-13 DIAGNOSIS — M25511 Pain in right shoulder: Secondary | ICD-10-CM | POA: Diagnosis not present

## 2018-10-13 DIAGNOSIS — M6281 Muscle weakness (generalized): Secondary | ICD-10-CM | POA: Diagnosis not present

## 2018-10-13 DIAGNOSIS — M25611 Stiffness of right shoulder, not elsewhere classified: Secondary | ICD-10-CM | POA: Diagnosis not present

## 2018-10-17 DIAGNOSIS — M25511 Pain in right shoulder: Secondary | ICD-10-CM | POA: Diagnosis not present

## 2018-10-17 DIAGNOSIS — M6281 Muscle weakness (generalized): Secondary | ICD-10-CM | POA: Diagnosis not present

## 2018-10-17 DIAGNOSIS — M25611 Stiffness of right shoulder, not elsewhere classified: Secondary | ICD-10-CM | POA: Diagnosis not present

## 2018-10-20 DIAGNOSIS — M25511 Pain in right shoulder: Secondary | ICD-10-CM | POA: Diagnosis not present

## 2018-10-20 DIAGNOSIS — M6281 Muscle weakness (generalized): Secondary | ICD-10-CM | POA: Diagnosis not present

## 2018-10-20 DIAGNOSIS — M25611 Stiffness of right shoulder, not elsewhere classified: Secondary | ICD-10-CM | POA: Diagnosis not present

## 2018-10-24 DIAGNOSIS — M25611 Stiffness of right shoulder, not elsewhere classified: Secondary | ICD-10-CM | POA: Diagnosis not present

## 2018-10-24 DIAGNOSIS — M6281 Muscle weakness (generalized): Secondary | ICD-10-CM | POA: Diagnosis not present

## 2018-10-24 DIAGNOSIS — M25511 Pain in right shoulder: Secondary | ICD-10-CM | POA: Diagnosis not present

## 2018-10-27 DIAGNOSIS — M25611 Stiffness of right shoulder, not elsewhere classified: Secondary | ICD-10-CM | POA: Diagnosis not present

## 2018-10-27 DIAGNOSIS — M6281 Muscle weakness (generalized): Secondary | ICD-10-CM | POA: Diagnosis not present

## 2018-10-27 DIAGNOSIS — M25511 Pain in right shoulder: Secondary | ICD-10-CM | POA: Diagnosis not present

## 2018-10-31 DIAGNOSIS — M25511 Pain in right shoulder: Secondary | ICD-10-CM | POA: Diagnosis not present

## 2018-10-31 DIAGNOSIS — M25611 Stiffness of right shoulder, not elsewhere classified: Secondary | ICD-10-CM | POA: Diagnosis not present

## 2018-10-31 DIAGNOSIS — M6281 Muscle weakness (generalized): Secondary | ICD-10-CM | POA: Diagnosis not present

## 2018-11-02 DIAGNOSIS — M25511 Pain in right shoulder: Secondary | ICD-10-CM | POA: Diagnosis not present

## 2018-11-02 DIAGNOSIS — M25611 Stiffness of right shoulder, not elsewhere classified: Secondary | ICD-10-CM | POA: Diagnosis not present

## 2018-11-02 DIAGNOSIS — M6281 Muscle weakness (generalized): Secondary | ICD-10-CM | POA: Diagnosis not present

## 2018-11-07 DIAGNOSIS — M6281 Muscle weakness (generalized): Secondary | ICD-10-CM | POA: Diagnosis not present

## 2018-11-07 DIAGNOSIS — M25511 Pain in right shoulder: Secondary | ICD-10-CM | POA: Diagnosis not present

## 2018-11-07 DIAGNOSIS — M25611 Stiffness of right shoulder, not elsewhere classified: Secondary | ICD-10-CM | POA: Diagnosis not present

## 2018-11-10 DIAGNOSIS — M25611 Stiffness of right shoulder, not elsewhere classified: Secondary | ICD-10-CM | POA: Diagnosis not present

## 2018-11-10 DIAGNOSIS — M25511 Pain in right shoulder: Secondary | ICD-10-CM | POA: Diagnosis not present

## 2018-11-10 DIAGNOSIS — M6281 Muscle weakness (generalized): Secondary | ICD-10-CM | POA: Diagnosis not present

## 2018-11-14 DIAGNOSIS — M6281 Muscle weakness (generalized): Secondary | ICD-10-CM | POA: Diagnosis not present

## 2018-11-14 DIAGNOSIS — M25511 Pain in right shoulder: Secondary | ICD-10-CM | POA: Diagnosis not present

## 2018-11-14 DIAGNOSIS — M25611 Stiffness of right shoulder, not elsewhere classified: Secondary | ICD-10-CM | POA: Diagnosis not present

## 2018-11-21 DIAGNOSIS — M6281 Muscle weakness (generalized): Secondary | ICD-10-CM | POA: Diagnosis not present

## 2018-11-21 DIAGNOSIS — I10 Essential (primary) hypertension: Secondary | ICD-10-CM | POA: Diagnosis not present

## 2018-11-21 DIAGNOSIS — M25511 Pain in right shoulder: Secondary | ICD-10-CM | POA: Diagnosis not present

## 2018-11-21 DIAGNOSIS — M25611 Stiffness of right shoulder, not elsewhere classified: Secondary | ICD-10-CM | POA: Diagnosis not present

## 2018-11-24 DIAGNOSIS — M25611 Stiffness of right shoulder, not elsewhere classified: Secondary | ICD-10-CM | POA: Diagnosis not present

## 2018-11-24 DIAGNOSIS — M25511 Pain in right shoulder: Secondary | ICD-10-CM | POA: Diagnosis not present

## 2018-11-24 DIAGNOSIS — M6281 Muscle weakness (generalized): Secondary | ICD-10-CM | POA: Diagnosis not present

## 2018-11-28 DIAGNOSIS — M6281 Muscle weakness (generalized): Secondary | ICD-10-CM | POA: Diagnosis not present

## 2018-11-28 DIAGNOSIS — M25511 Pain in right shoulder: Secondary | ICD-10-CM | POA: Diagnosis not present

## 2018-11-28 DIAGNOSIS — M25611 Stiffness of right shoulder, not elsewhere classified: Secondary | ICD-10-CM | POA: Diagnosis not present

## 2018-12-01 DIAGNOSIS — M25611 Stiffness of right shoulder, not elsewhere classified: Secondary | ICD-10-CM | POA: Diagnosis not present

## 2018-12-01 DIAGNOSIS — M25511 Pain in right shoulder: Secondary | ICD-10-CM | POA: Diagnosis not present

## 2018-12-01 DIAGNOSIS — M6281 Muscle weakness (generalized): Secondary | ICD-10-CM | POA: Diagnosis not present

## 2018-12-05 DIAGNOSIS — M6281 Muscle weakness (generalized): Secondary | ICD-10-CM | POA: Diagnosis not present

## 2018-12-05 DIAGNOSIS — M25611 Stiffness of right shoulder, not elsewhere classified: Secondary | ICD-10-CM | POA: Diagnosis not present

## 2018-12-05 DIAGNOSIS — M25511 Pain in right shoulder: Secondary | ICD-10-CM | POA: Diagnosis not present

## 2018-12-08 DIAGNOSIS — M25611 Stiffness of right shoulder, not elsewhere classified: Secondary | ICD-10-CM | POA: Diagnosis not present

## 2018-12-08 DIAGNOSIS — M25511 Pain in right shoulder: Secondary | ICD-10-CM | POA: Diagnosis not present

## 2018-12-08 DIAGNOSIS — M6281 Muscle weakness (generalized): Secondary | ICD-10-CM | POA: Diagnosis not present

## 2018-12-12 DIAGNOSIS — M25511 Pain in right shoulder: Secondary | ICD-10-CM | POA: Diagnosis not present

## 2018-12-12 DIAGNOSIS — M25611 Stiffness of right shoulder, not elsewhere classified: Secondary | ICD-10-CM | POA: Diagnosis not present

## 2018-12-12 DIAGNOSIS — M6281 Muscle weakness (generalized): Secondary | ICD-10-CM | POA: Diagnosis not present

## 2018-12-15 DIAGNOSIS — M25611 Stiffness of right shoulder, not elsewhere classified: Secondary | ICD-10-CM | POA: Diagnosis not present

## 2018-12-15 DIAGNOSIS — M6281 Muscle weakness (generalized): Secondary | ICD-10-CM | POA: Diagnosis not present

## 2018-12-15 DIAGNOSIS — M25511 Pain in right shoulder: Secondary | ICD-10-CM | POA: Diagnosis not present

## 2018-12-19 DIAGNOSIS — M25611 Stiffness of right shoulder, not elsewhere classified: Secondary | ICD-10-CM | POA: Diagnosis not present

## 2018-12-19 DIAGNOSIS — M25511 Pain in right shoulder: Secondary | ICD-10-CM | POA: Diagnosis not present

## 2018-12-19 DIAGNOSIS — M6281 Muscle weakness (generalized): Secondary | ICD-10-CM | POA: Diagnosis not present

## 2018-12-22 DIAGNOSIS — M25611 Stiffness of right shoulder, not elsewhere classified: Secondary | ICD-10-CM | POA: Diagnosis not present

## 2018-12-22 DIAGNOSIS — M6281 Muscle weakness (generalized): Secondary | ICD-10-CM | POA: Diagnosis not present

## 2018-12-22 DIAGNOSIS — M25511 Pain in right shoulder: Secondary | ICD-10-CM | POA: Diagnosis not present

## 2018-12-26 DIAGNOSIS — M25611 Stiffness of right shoulder, not elsewhere classified: Secondary | ICD-10-CM | POA: Diagnosis not present

## 2018-12-26 DIAGNOSIS — M25511 Pain in right shoulder: Secondary | ICD-10-CM | POA: Diagnosis not present

## 2018-12-26 DIAGNOSIS — M6281 Muscle weakness (generalized): Secondary | ICD-10-CM | POA: Diagnosis not present

## 2018-12-29 DIAGNOSIS — M6281 Muscle weakness (generalized): Secondary | ICD-10-CM | POA: Diagnosis not present

## 2018-12-29 DIAGNOSIS — M25611 Stiffness of right shoulder, not elsewhere classified: Secondary | ICD-10-CM | POA: Diagnosis not present

## 2018-12-29 DIAGNOSIS — M25511 Pain in right shoulder: Secondary | ICD-10-CM | POA: Diagnosis not present

## 2019-01-04 DIAGNOSIS — M25511 Pain in right shoulder: Secondary | ICD-10-CM | POA: Diagnosis not present

## 2019-01-04 DIAGNOSIS — M25611 Stiffness of right shoulder, not elsewhere classified: Secondary | ICD-10-CM | POA: Diagnosis not present

## 2019-01-04 DIAGNOSIS — M6281 Muscle weakness (generalized): Secondary | ICD-10-CM | POA: Diagnosis not present

## 2019-01-05 DIAGNOSIS — M19011 Primary osteoarthritis, right shoulder: Secondary | ICD-10-CM | POA: Diagnosis not present

## 2019-01-12 DIAGNOSIS — M6281 Muscle weakness (generalized): Secondary | ICD-10-CM | POA: Diagnosis not present

## 2019-01-12 DIAGNOSIS — M25511 Pain in right shoulder: Secondary | ICD-10-CM | POA: Diagnosis not present

## 2019-01-12 DIAGNOSIS — M25611 Stiffness of right shoulder, not elsewhere classified: Secondary | ICD-10-CM | POA: Diagnosis not present

## 2019-01-16 DIAGNOSIS — M25511 Pain in right shoulder: Secondary | ICD-10-CM | POA: Diagnosis not present

## 2019-01-16 DIAGNOSIS — M6281 Muscle weakness (generalized): Secondary | ICD-10-CM | POA: Diagnosis not present

## 2019-01-16 DIAGNOSIS — M25611 Stiffness of right shoulder, not elsewhere classified: Secondary | ICD-10-CM | POA: Diagnosis not present

## 2019-01-19 DIAGNOSIS — M25511 Pain in right shoulder: Secondary | ICD-10-CM | POA: Diagnosis not present

## 2019-01-19 DIAGNOSIS — M25611 Stiffness of right shoulder, not elsewhere classified: Secondary | ICD-10-CM | POA: Diagnosis not present

## 2019-01-19 DIAGNOSIS — M6281 Muscle weakness (generalized): Secondary | ICD-10-CM | POA: Diagnosis not present

## 2019-01-23 DIAGNOSIS — M25511 Pain in right shoulder: Secondary | ICD-10-CM | POA: Diagnosis not present

## 2019-01-23 DIAGNOSIS — M25611 Stiffness of right shoulder, not elsewhere classified: Secondary | ICD-10-CM | POA: Diagnosis not present

## 2019-01-23 DIAGNOSIS — M6281 Muscle weakness (generalized): Secondary | ICD-10-CM | POA: Diagnosis not present

## 2019-01-26 DIAGNOSIS — M25611 Stiffness of right shoulder, not elsewhere classified: Secondary | ICD-10-CM | POA: Diagnosis not present

## 2019-01-26 DIAGNOSIS — M6281 Muscle weakness (generalized): Secondary | ICD-10-CM | POA: Diagnosis not present

## 2019-01-26 DIAGNOSIS — M25511 Pain in right shoulder: Secondary | ICD-10-CM | POA: Diagnosis not present

## 2019-01-30 DIAGNOSIS — M25611 Stiffness of right shoulder, not elsewhere classified: Secondary | ICD-10-CM | POA: Diagnosis not present

## 2019-01-30 DIAGNOSIS — M25511 Pain in right shoulder: Secondary | ICD-10-CM | POA: Diagnosis not present

## 2019-01-30 DIAGNOSIS — M6281 Muscle weakness (generalized): Secondary | ICD-10-CM | POA: Diagnosis not present

## 2019-02-02 DIAGNOSIS — M6281 Muscle weakness (generalized): Secondary | ICD-10-CM | POA: Diagnosis not present

## 2019-02-02 DIAGNOSIS — M25611 Stiffness of right shoulder, not elsewhere classified: Secondary | ICD-10-CM | POA: Diagnosis not present

## 2019-02-02 DIAGNOSIS — M25511 Pain in right shoulder: Secondary | ICD-10-CM | POA: Diagnosis not present

## 2019-02-05 DIAGNOSIS — R1030 Lower abdominal pain, unspecified: Secondary | ICD-10-CM | POA: Diagnosis not present

## 2019-02-05 DIAGNOSIS — Z1389 Encounter for screening for other disorder: Secondary | ICD-10-CM | POA: Diagnosis not present

## 2019-02-05 DIAGNOSIS — K760 Fatty (change of) liver, not elsewhere classified: Secondary | ICD-10-CM | POA: Diagnosis not present

## 2019-02-05 DIAGNOSIS — Z8042 Family history of malignant neoplasm of prostate: Secondary | ICD-10-CM | POA: Diagnosis not present

## 2019-02-06 DIAGNOSIS — M25511 Pain in right shoulder: Secondary | ICD-10-CM | POA: Diagnosis not present

## 2019-02-06 DIAGNOSIS — M25611 Stiffness of right shoulder, not elsewhere classified: Secondary | ICD-10-CM | POA: Diagnosis not present

## 2019-02-06 DIAGNOSIS — M6281 Muscle weakness (generalized): Secondary | ICD-10-CM | POA: Diagnosis not present

## 2019-02-07 DIAGNOSIS — Z4789 Encounter for other orthopedic aftercare: Secondary | ICD-10-CM | POA: Diagnosis not present

## 2019-02-09 DIAGNOSIS — M6281 Muscle weakness (generalized): Secondary | ICD-10-CM | POA: Diagnosis not present

## 2019-02-09 DIAGNOSIS — M25511 Pain in right shoulder: Secondary | ICD-10-CM | POA: Diagnosis not present

## 2019-02-09 DIAGNOSIS — M25611 Stiffness of right shoulder, not elsewhere classified: Secondary | ICD-10-CM | POA: Diagnosis not present

## 2019-02-13 DIAGNOSIS — M6281 Muscle weakness (generalized): Secondary | ICD-10-CM | POA: Diagnosis not present

## 2019-02-13 DIAGNOSIS — M25511 Pain in right shoulder: Secondary | ICD-10-CM | POA: Diagnosis not present

## 2019-02-13 DIAGNOSIS — M25611 Stiffness of right shoulder, not elsewhere classified: Secondary | ICD-10-CM | POA: Diagnosis not present

## 2019-02-16 DIAGNOSIS — M25511 Pain in right shoulder: Secondary | ICD-10-CM | POA: Diagnosis not present

## 2019-02-16 DIAGNOSIS — M6281 Muscle weakness (generalized): Secondary | ICD-10-CM | POA: Diagnosis not present

## 2019-02-16 DIAGNOSIS — M25611 Stiffness of right shoulder, not elsewhere classified: Secondary | ICD-10-CM | POA: Diagnosis not present

## 2019-02-20 DIAGNOSIS — M25611 Stiffness of right shoulder, not elsewhere classified: Secondary | ICD-10-CM | POA: Diagnosis not present

## 2019-02-20 DIAGNOSIS — M6281 Muscle weakness (generalized): Secondary | ICD-10-CM | POA: Diagnosis not present

## 2019-02-20 DIAGNOSIS — M25511 Pain in right shoulder: Secondary | ICD-10-CM | POA: Diagnosis not present

## 2019-02-23 DIAGNOSIS — M6281 Muscle weakness (generalized): Secondary | ICD-10-CM | POA: Diagnosis not present

## 2019-02-23 DIAGNOSIS — M25611 Stiffness of right shoulder, not elsewhere classified: Secondary | ICD-10-CM | POA: Diagnosis not present

## 2019-02-23 DIAGNOSIS — M25511 Pain in right shoulder: Secondary | ICD-10-CM | POA: Diagnosis not present

## 2019-02-27 DIAGNOSIS — M6281 Muscle weakness (generalized): Secondary | ICD-10-CM | POA: Diagnosis not present

## 2019-02-27 DIAGNOSIS — M25611 Stiffness of right shoulder, not elsewhere classified: Secondary | ICD-10-CM | POA: Diagnosis not present

## 2019-02-27 DIAGNOSIS — M25511 Pain in right shoulder: Secondary | ICD-10-CM | POA: Diagnosis not present

## 2019-03-02 DIAGNOSIS — M25511 Pain in right shoulder: Secondary | ICD-10-CM | POA: Diagnosis not present

## 2019-03-02 DIAGNOSIS — M6281 Muscle weakness (generalized): Secondary | ICD-10-CM | POA: Diagnosis not present

## 2019-03-02 DIAGNOSIS — M25611 Stiffness of right shoulder, not elsewhere classified: Secondary | ICD-10-CM | POA: Diagnosis not present

## 2019-03-06 DIAGNOSIS — M6281 Muscle weakness (generalized): Secondary | ICD-10-CM | POA: Diagnosis not present

## 2019-03-06 DIAGNOSIS — M25611 Stiffness of right shoulder, not elsewhere classified: Secondary | ICD-10-CM | POA: Diagnosis not present

## 2019-03-06 DIAGNOSIS — M25511 Pain in right shoulder: Secondary | ICD-10-CM | POA: Diagnosis not present

## 2019-03-26 DIAGNOSIS — M25561 Pain in right knee: Secondary | ICD-10-CM | POA: Diagnosis not present

## 2019-03-26 DIAGNOSIS — M1711 Unilateral primary osteoarthritis, right knee: Secondary | ICD-10-CM | POA: Diagnosis not present

## 2019-04-05 DIAGNOSIS — M25561 Pain in right knee: Secondary | ICD-10-CM | POA: Diagnosis not present

## 2019-04-13 DIAGNOSIS — M1711 Unilateral primary osteoarthritis, right knee: Secondary | ICD-10-CM | POA: Diagnosis not present

## 2019-09-07 IMAGING — US US ABDOMEN LIMITED
1 series · 14 of 25 positions shown · non-contrast
Comparison: CT 10/21/2007

CLINICAL DATA: Fatty liver

EXAM:
ULTRASOUND ABDOMEN LIMITED RIGHT UPPER QUADRANT

[Series 1: us abdomen limited · 0.22mm/px · 14 of 39 slices shown]
[im 1/39]
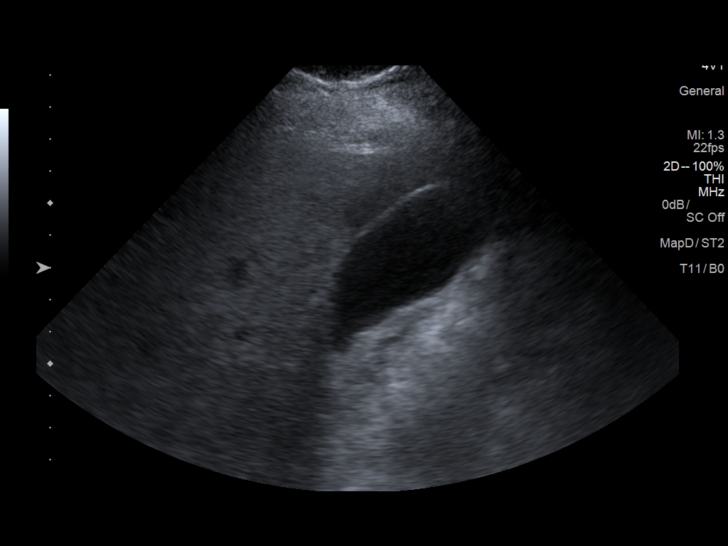
[im 4/39]
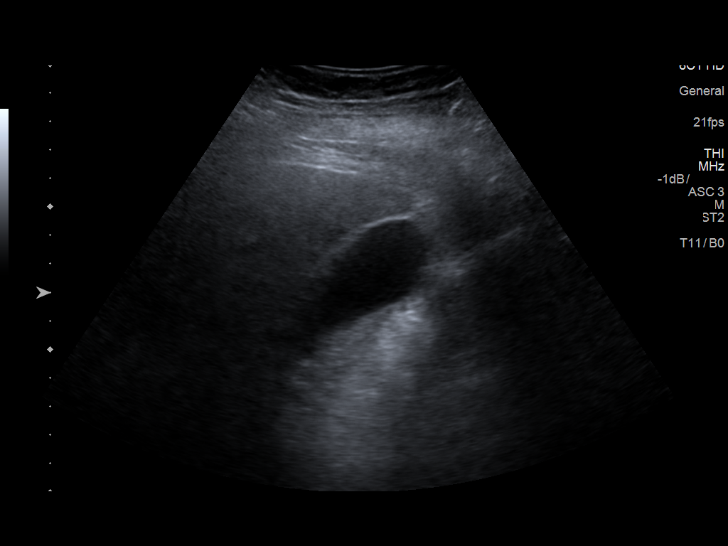
[im 7/39]
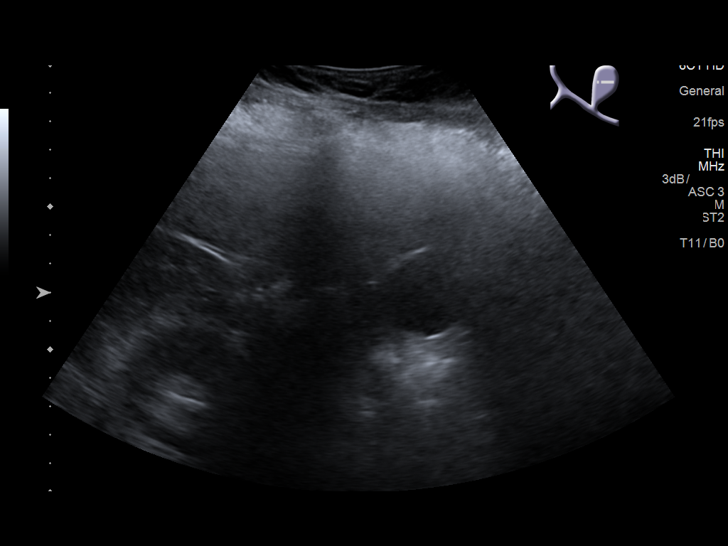
[im 10/39]
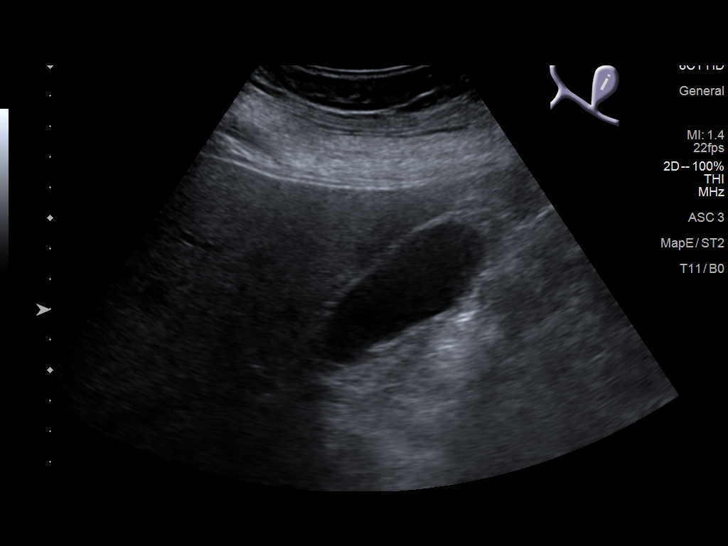
[im 13/39]
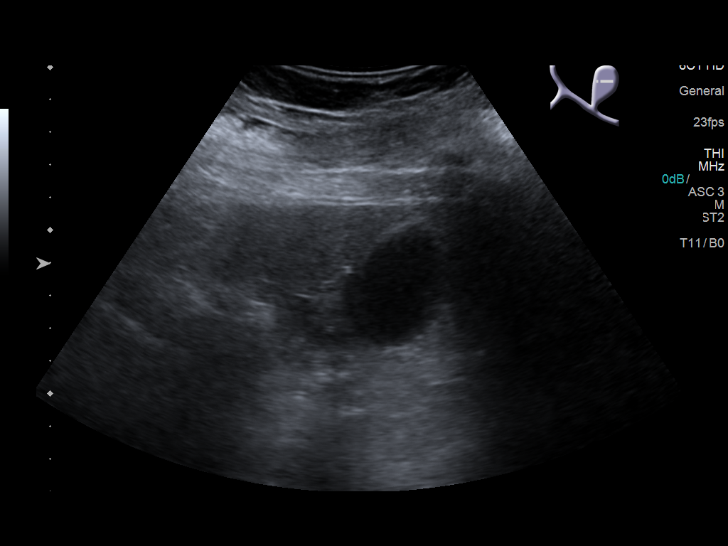
[im 15/39]
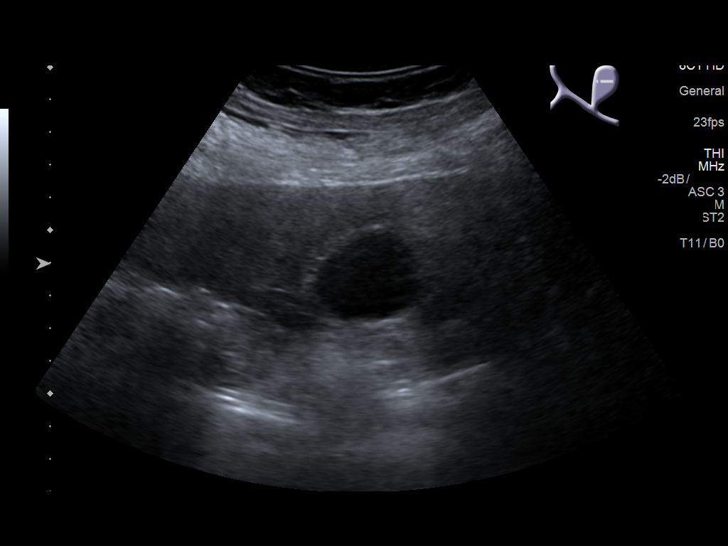
[im 18/39]
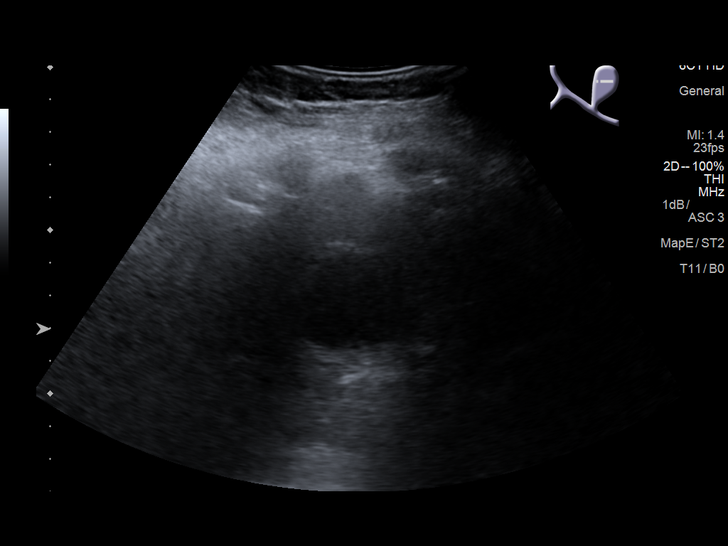
[im 21/39]
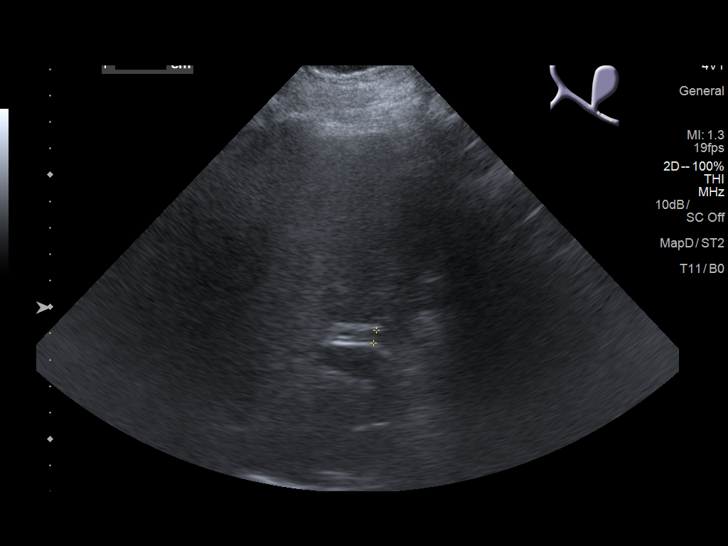
[im 24/39]
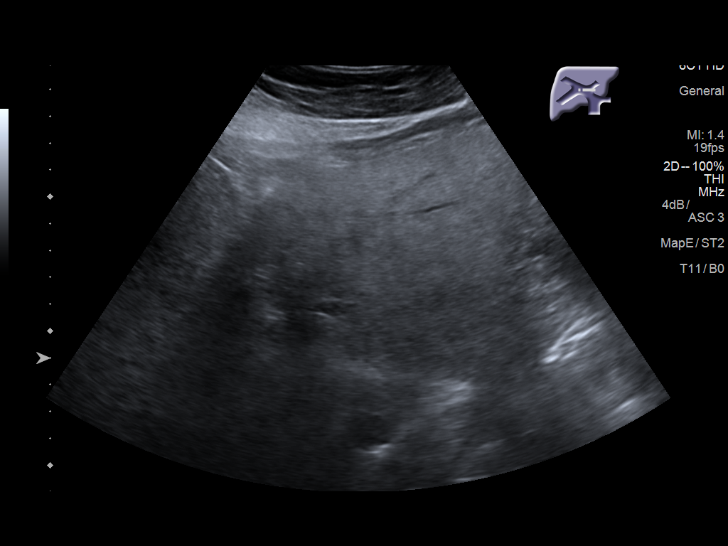
[im 26/39]
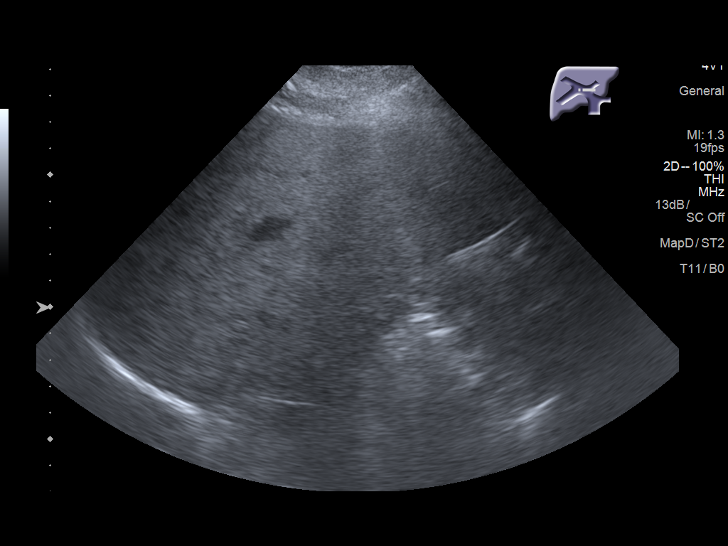
[im 29/39]
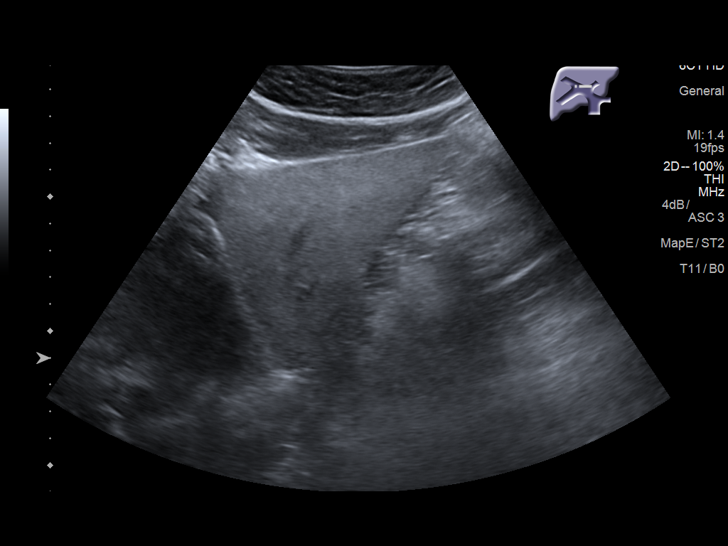
[im 32/39]
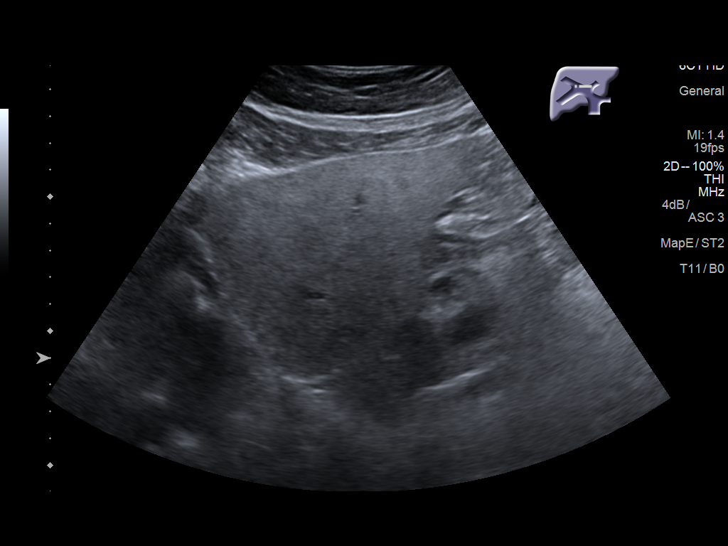
[im 35/39]
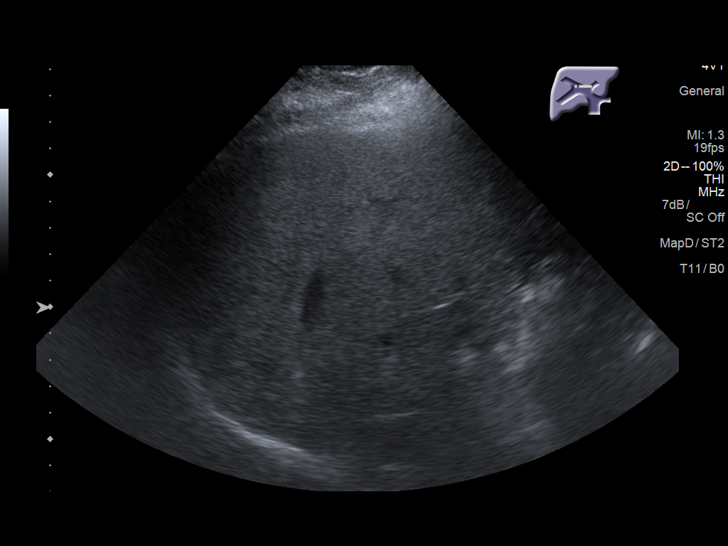
[im 39/39]
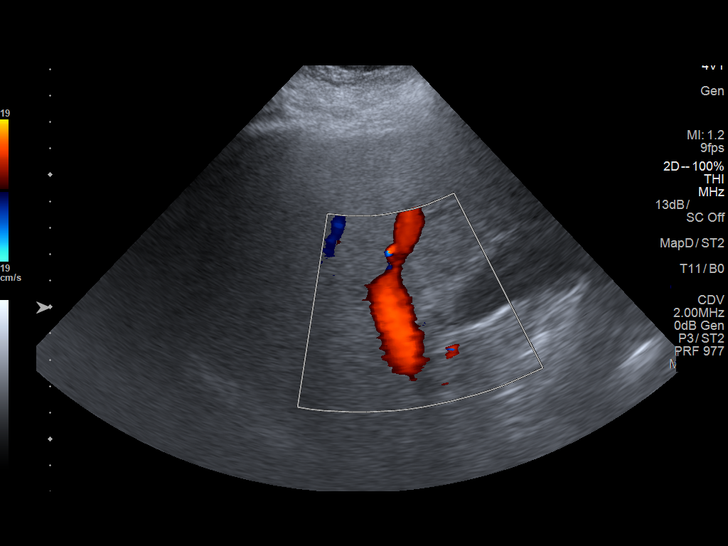

[14 of 25 positions shown; findings below may reference images not displayed]

FINDINGS: Gallbladder:

No gallstones or wall thickening visualized. No sonographic Murphy
sign noted by sonographer.

Common bile duct:

Diameter: Normal caliber, 5 mm

Liver:

Increased echotexture compatible with fatty infiltration. No focal
abnormality or biliary ductal dilatation. Portal vein is patent on
color Doppler imaging with normal direction of blood flow towards
the liver.
IMPRESSION: Fatty infiltration of the liver.  No acute findings.

## 2019-09-25 DIAGNOSIS — Z20828 Contact with and (suspected) exposure to other viral communicable diseases: Secondary | ICD-10-CM | POA: Diagnosis not present

## 2019-09-25 DIAGNOSIS — M170 Bilateral primary osteoarthritis of knee: Secondary | ICD-10-CM | POA: Diagnosis not present

## 2019-09-25 DIAGNOSIS — I10 Essential (primary) hypertension: Secondary | ICD-10-CM | POA: Diagnosis not present

## 2019-09-25 DIAGNOSIS — K760 Fatty (change of) liver, not elsewhere classified: Secondary | ICD-10-CM | POA: Diagnosis not present

## 2019-09-25 DIAGNOSIS — Z1331 Encounter for screening for depression: Secondary | ICD-10-CM | POA: Diagnosis not present

## 2019-09-25 DIAGNOSIS — E7849 Other hyperlipidemia: Secondary | ICD-10-CM | POA: Diagnosis not present

## 2019-12-17 DIAGNOSIS — Z0000 Encounter for general adult medical examination without abnormal findings: Secondary | ICD-10-CM | POA: Diagnosis not present

## 2019-12-17 DIAGNOSIS — E7849 Other hyperlipidemia: Secondary | ICD-10-CM | POA: Diagnosis not present

## 2019-12-17 DIAGNOSIS — I10 Essential (primary) hypertension: Secondary | ICD-10-CM | POA: Diagnosis not present

## 2019-12-24 DIAGNOSIS — K760 Fatty (change of) liver, not elsewhere classified: Secondary | ICD-10-CM | POA: Diagnosis not present

## 2019-12-24 DIAGNOSIS — R7303 Prediabetes: Secondary | ICD-10-CM | POA: Diagnosis not present

## 2019-12-24 DIAGNOSIS — Z1212 Encounter for screening for malignant neoplasm of rectum: Secondary | ICD-10-CM | POA: Diagnosis not present

## 2019-12-24 DIAGNOSIS — R82998 Other abnormal findings in urine: Secondary | ICD-10-CM | POA: Diagnosis not present

## 2019-12-24 DIAGNOSIS — Z23 Encounter for immunization: Secondary | ICD-10-CM | POA: Diagnosis not present

## 2019-12-24 DIAGNOSIS — I10 Essential (primary) hypertension: Secondary | ICD-10-CM | POA: Diagnosis not present

## 2019-12-24 DIAGNOSIS — Z0000 Encounter for general adult medical examination without abnormal findings: Secondary | ICD-10-CM | POA: Diagnosis not present

## 2019-12-24 DIAGNOSIS — E785 Hyperlipidemia, unspecified: Secondary | ICD-10-CM | POA: Diagnosis not present

## 2020-01-17 DIAGNOSIS — Z23 Encounter for immunization: Secondary | ICD-10-CM | POA: Diagnosis not present

## 2020-01-25 ENCOUNTER — Encounter: Payer: Self-pay | Admitting: Internal Medicine

## 2020-03-13 DIAGNOSIS — H15102 Unspecified episcleritis, left eye: Secondary | ICD-10-CM | POA: Diagnosis not present

## 2020-06-27 DIAGNOSIS — Z23 Encounter for immunization: Secondary | ICD-10-CM | POA: Diagnosis not present

## 2020-06-27 DIAGNOSIS — R7303 Prediabetes: Secondary | ICD-10-CM | POA: Diagnosis not present

## 2020-06-27 DIAGNOSIS — E785 Hyperlipidemia, unspecified: Secondary | ICD-10-CM | POA: Diagnosis not present

## 2020-06-27 DIAGNOSIS — I10 Essential (primary) hypertension: Secondary | ICD-10-CM | POA: Diagnosis not present

## 2020-07-29 DIAGNOSIS — E785 Hyperlipidemia, unspecified: Secondary | ICD-10-CM | POA: Diagnosis not present

## 2020-08-19 DIAGNOSIS — E785 Hyperlipidemia, unspecified: Secondary | ICD-10-CM | POA: Diagnosis not present

## 2020-09-17 DIAGNOSIS — H5213 Myopia, bilateral: Secondary | ICD-10-CM | POA: Diagnosis not present

## 2020-09-17 DIAGNOSIS — H15102 Unspecified episcleritis, left eye: Secondary | ICD-10-CM | POA: Diagnosis not present

## 2020-09-22 ENCOUNTER — Other Ambulatory Visit: Payer: Self-pay

## 2020-09-22 ENCOUNTER — Encounter: Payer: Self-pay | Admitting: Allergy and Immunology

## 2020-09-22 ENCOUNTER — Ambulatory Visit (INDEPENDENT_AMBULATORY_CARE_PROVIDER_SITE_OTHER): Payer: BC Managed Care – PPO | Admitting: Allergy and Immunology

## 2020-09-22 VITALS — BP 132/78 | HR 78 | Temp 97.2°F | Resp 20 | Ht 71.0 in | Wt 233.0 lb

## 2020-09-22 DIAGNOSIS — J3089 Other allergic rhinitis: Secondary | ICD-10-CM

## 2020-09-22 DIAGNOSIS — T783XXD Angioneurotic edema, subsequent encounter: Secondary | ICD-10-CM

## 2020-09-22 DIAGNOSIS — J301 Allergic rhinitis due to pollen: Secondary | ICD-10-CM

## 2020-09-22 DIAGNOSIS — T781XXD Other adverse food reactions, not elsewhere classified, subsequent encounter: Secondary | ICD-10-CM

## 2020-09-22 DIAGNOSIS — T7840XD Allergy, unspecified, subsequent encounter: Secondary | ICD-10-CM

## 2020-09-22 MED ORDER — EPINEPHRINE 0.3 MG/0.3ML IJ SOAJ: 0.3000 mg | INTRAMUSCULAR | 1 refills | Status: AC | PRN

## 2020-09-22 NOTE — Patient Instructions (Addendum)
  1.  Every day use a preventative antihistamine: Cetirizine 10 mg 1 time per day  2.  If needed:   A.  Benadryl  B.  Auvi-Q 0.3, Benadryl, MD/ER evaluation for reaction  3.  Review previous blood test from 2022: Additional blood test???  4.  Contact clinic if recurrent swelling episodes  5.  Return to clinic in 6 months or earlier if problem

## 2020-09-22 NOTE — Progress Notes (Signed)
[place] [facility] [place] [facility] - [place] [facility] [place]   Dear Dr. Francesco Sor,  Thank you for referring Evan Wallace to the [facility] on 09/22/2020.   Below is a summation of this patient's evaluation and recommendations.  Thank you for your referral. I will keep you informed about this patient's response to treatment.   If you have any questions please do not hesitate to contact me.   Sincerely,  Jiles Prows, MD Allergy / Immunology [place]   ______________________________________________________________________    NEW PATIENT NOTE  Referring Provider: Sueanne Margarita, DO Primary Provider: Sueanne Margarita, DO Date of office visit: 09/22/2020    Subjective:   Chief Complaint:  Evan Wallace (DOB: 06/16/1960) is a 60 y.o. male who presents to the clinic on 09/22/2020 with a chief complaint of Oral Swelling (A dozen episodes of lip swelling that started in January) and Allergies (Spring is worst season) .     HPI: Raudel presents to this clinic in evaluation of 2 main issues.  First, he has been having recurrent lip swelling since January 2022.  His episodes occur about every 3 weeks and his last episode was about 1 and half weeks ago.  It involves predominantly 1 lip without any associated swelling in other parts of his body and no associated systemic or constitutional symptoms.  He will take Benadryl throughout the day and usually his swelling is gone within a day.  He did have his lisinopril removed several months ago and he still continues to have problems with swelling.  There is not really an obvious provoking factor giving rise to this issue.  He has not had a significant environmental change, started any new medications, started new supplements or health foods or herbs, and has not had symptoms to suggest an ongoing infectious disease, and apparently has had screening blood  test performed which were normal.  Second, he has springtime allergic rhinoconjunctivitis with nasal congestion and sneezing and some itchy eyes for which he will take a Claritin or Zyrtec which works quite well.  As well, he has noticed since he has discontinued his lisinopril and has been using a combination of hydrochlorothiazide and amlodipine that his blood pressure is not as well under control as it was with lisinopril.  Past Medical History:  Diagnosis Date  . Back pain   . Colon polyps   . Hypertension   . Kidney stones   . Syrinx of spinal cord - on MRI 08/2013, followed by orthopedics 11/30/2013  . Tobacco use disorder 11/13/2013    Past Surgical History:  Procedure Laterality Date  . HERNIA REPAIR    . KNEE ARTHROSCOPY Left 12/01/2015  . TONSILLECTOMY  1967    Allergies as of 09/22/2020      Reactions   Ace Inhibitors Swelling      Medication List    amLODipine 10 MG tablet Commonly known as: NORVASC Take 1 tablet by mouth daily.   hydrochlorothiazide 25 MG tablet Commonly known as: HYDRODIURIL Take 1 tablet by mouth daily.       Review of systems negative except as noted in HPI / PMHx or noted below:  Review of Systems  Constitutional: Negative.   HENT: Negative.   Eyes: Negative.   Respiratory: Negative.   Cardiovascular: Negative.   Gastrointestinal: Negative.   Genitourinary: Negative.   Musculoskeletal: Negative.   Skin: Negative.   Neurological: Negative.  Endo/Heme/Allergies: Negative.   Psychiatric/Behavioral: Negative.     Family History  Problem Relation Age of Onset  . Hypertension Father   . Kidney disease Father   . Cancer Father 55       colon cancer  . Hypertension Mother   . Allergic rhinitis Mother   . Eczema Grandchild   . Asthma Neg Hx   . Urticaria Neg Hx   . Angioedema Neg Hx     Social History   Socioeconomic History  . Marital status: Married    Spouse name: Not on file  . Number of children: Not on file  .  Years of education: Not on file  . Highest education level: Not on file  Occupational History  . Not on file  Tobacco Use  . Smoking status: Former Smoker    Packs/day: 1.00    Years: 10.00    Pack years: 10.00    Types: Cigarettes    Quit date: 12/21/2013    Years since quitting: 6.7  . Smokeless tobacco: Never Used  Vaping Use  . Vaping Use: Never used  Substance and Sexual Activity  . Alcohol use: No  . Drug use: Never  . Sexual activity: Not on file  Other Topics Concern  . Not on file  Social History Narrative   Work or School: works for [employer] Situation: lives with wife      Spiritual Beliefs: methodist      Lifestyle: no regular exercise; so so            Environmental and Social history  Lives in a mobile home with a dry environment, no animals located inside the household, carpet in the bedroom, and plastic on the bed, no plastic on the pillow, and no smoking ongoing with inside the household.  He is presently retired.  Objective:   Vitals:   09/22/20 0920  BP: 132/78  Pulse: 78  Resp: 20  Temp: (!) 97.2 F (36.2 C)  SpO2: 97%   Height: 5\' 11"  (180.3 cm) Weight: 233 lb (105.7 kg)  Physical Exam Constitutional:      Appearance: He is not diaphoretic.  HENT:     Head: Normocephalic.     Right Ear: Tympanic membrane, ear canal and external ear normal.     Left Ear: Tympanic membrane, ear canal and external ear normal.     Nose: Nose normal. No mucosal edema or rhinorrhea.     Mouth/Throat:     Pharynx: Uvula midline. No oropharyngeal exudate.  Eyes:     Conjunctiva/sclera: Conjunctivae normal.  Neck:     Thyroid: No thyromegaly.     Trachea: Trachea normal. No tracheal tenderness or tracheal deviation.  Cardiovascular:     Rate and Rhythm: Normal rate and regular rhythm.     Heart sounds: Normal heart sounds, S1 normal and S2 normal. No murmur heard.   Pulmonary:     Effort: No respiratory distress.      Breath sounds: Normal breath sounds. No stridor. No wheezing or rales.  Lymphadenopathy:     Head:     Right side of head: No tonsillar adenopathy.     Left side of head: No tonsillar adenopathy.     Cervical: No cervical adenopathy.  Skin:    Findings: No erythema or rash.     Nails: There is no clubbing.  Neurological:     Mental Status: He is alert.     Diagnostics:  Allergy skin tests were performed.  He demonstrated hypersensitivity to trees, grasses, and weeds.  He did not demonstrate any hypersensitivity against a screening panel of foods.  Assessment and Plan:    1. Angioedema, subsequent encounter   2. Allergic reaction, subsequent encounter   3. Seasonal allergic rhinitis due to pollen     1.  Every day use a preventative antihistamine: Cetirizine 10 mg 1 time per day  2.  If needed:   A.  Benadryl  B.  Auvi-Q 0.3, Benadryl, MD/ER evaluation for reaction  3.  Review previous blood test from 2022: Additional blood test???  4.  Contact clinic if recurrent swelling episodes  5.  Return to clinic in 6 months or earlier if problem  Cicero appears to have some form of immune activation giving rise to recurrent episodes of allergic reaction with angioedema with unknown etiologic factor.  He certainly has an atopic immune system giving rise to seasonal allergic rhinitis but I am not sure that that is the cause of his recurrent episodes of angioedema.  I am going to review his blood test results that have been performed over the course of the past 6 months and make a decision about further evaluation for the etiologic factor responsible for this issue.  I have encouraged him to consistently use an H1 receptor blocker and of course use injectable epinephrine if he ever develops a severe swelling reaction.  I will contact him once I have his previous blood test results available for review.  Jiles Prows, MD Allergy / Immunology [facility]

## 2020-09-23 ENCOUNTER — Encounter: Payer: Self-pay | Admitting: Allergy and Immunology

## 2020-09-24 ENCOUNTER — Telehealth: Payer: Self-pay

## 2020-09-24 DIAGNOSIS — T7840XD Allergy, unspecified, subsequent encounter: Secondary | ICD-10-CM | POA: Diagnosis not present

## 2020-09-24 DIAGNOSIS — T783XXD Angioneurotic edema, subsequent encounter: Secondary | ICD-10-CM | POA: Diagnosis not present

## 2020-09-24 DIAGNOSIS — R7989 Other specified abnormal findings of blood chemistry: Secondary | ICD-10-CM | POA: Diagnosis not present

## 2020-09-24 NOTE — Telephone Encounter (Signed)
Called patient and informed him that Dr. Neldon Mc received his previous blood tests and wants to have him get a few more blood tests drawn.  Asked patient to go to local [facility] at his convenience to have those additional tests performed.  Gave him the address for [facility] in [place].  Additional tests include: LFT Panel, HEP A/B/C Screen, CBC with Diff, TSH, Free T4, Thyroid Peroxidase, Alpha-Gal Panel for diagnosis of allergic reaction, angioedema, elevated liver function tests.

## 2020-09-25 LAB — HEPATIC FUNCTION PANEL: Bilirubin, Direct: <0.1 mg/dL (ref 0.00–0.40)

## 2020-10-01 LAB — THYROID PEROXIDASE ANTIBODY: Thyroperoxidase Ab SerPl-aCnc: <8 IU/mL (ref 0–34)

## 2020-10-01 LAB — HEPATIC FUNCTION PANEL
ALT: 127 IU/L — ABNORMAL HIGH (ref 0–44)
AST: 65 IU/L — ABNORMAL HIGH (ref 0–40)
Albumin: 4.8 g/dL (ref 3.8–4.8)
Alkaline Phosphatase: 87 IU/L (ref 44–121)
Bilirubin Total: <0.2 mg/dL (ref 0.0–1.2)
Total Protein: 7.2 g/dL (ref 6.0–8.5)

## 2020-10-01 LAB — CBC WITH DIFFERENTIAL/PLATELET
Basophils Absolute: 0 10*3/uL (ref 0.0–0.2)
Basos: 1 %
EOS (ABSOLUTE): 0.2 10*3/uL (ref 0.0–0.4)
Eos: 3 %
Hematocrit: 46 % (ref 37.5–51.0)
Hemoglobin: 15.3 g/dL (ref 13.0–17.7)
Immature Grans (Abs): 0 10*3/uL (ref 0.0–0.1)
Immature Granulocytes: 0 %
Lymphocytes Absolute: 1.8 10*3/uL (ref 0.7–3.1)
Lymphs: 31 %
MCH: 30.1 pg (ref 26.6–33.0)
MCHC: 33.3 g/dL (ref 31.5–35.7)
MCV: 91 fL (ref 79–97)
Monocytes Absolute: 0.6 10*3/uL (ref 0.1–0.9)
Monocytes: 11 %
Neutrophils Absolute: 3.1 10*3/uL (ref 1.4–7.0)
Neutrophils: 54 %
Platelets: 240 10*3/uL (ref 150–450)
RBC: 5.08 x10E6/uL (ref 4.14–5.80)
RDW: 13.2 % (ref 11.6–15.4)
WBC: 5.7 10*3/uL (ref 3.4–10.8)

## 2020-10-01 LAB — HAV, HBV, HCV
HCV Ab: <0.1 s/co ratio (ref 0.0–0.9)
Hep B Core Total Ab: NEGATIVE
Hep B Surface Ab, Qual: NONREACTIVE
Hepatitis B Surface Ag: NEGATIVE
hep A Total Ab: NEGATIVE

## 2020-10-01 LAB — ALPHA-GAL PANEL
Allergen Lamb IgE: <0.1 kU/L
Beef IgE: <0.1 kU/L
IgE (Immunoglobulin E), Serum: 370 IU/mL (ref 6–495)
O215-IgE Alpha-Gal: <0.1 kU/L
Pork IgE: <0.1 kU/L

## 2020-10-01 LAB — HCV INTERPRETATION

## 2020-10-01 LAB — TSH+FREE T4
Free T4: 1.23 ng/dL (ref 0.82–1.77)
TSH: 2.51 u[IU]/mL (ref 0.450–4.500)

## 2020-10-03 ENCOUNTER — Other Ambulatory Visit: Payer: Self-pay | Admitting: Allergy and Immunology

## 2020-10-03 DIAGNOSIS — R7989 Other specified abnormal findings of blood chemistry: Secondary | ICD-10-CM

## 2020-10-06 DIAGNOSIS — R7989 Other specified abnormal findings of blood chemistry: Secondary | ICD-10-CM | POA: Diagnosis not present

## 2020-10-09 LAB — PAN-ANCA
ANCA Proteinase 3: <3.5 U/mL (ref 0.0–3.5)
Atypical pANCA: <1:20 {titer}
C-ANCA: <1:20 {titer}
Myeloperoxidase Ab: <9 U/mL (ref 0.0–9.0)
P-ANCA: <1:20 {titer}

## 2020-10-09 LAB — ANTI-MICROSOMAL ANTIBODY LIVER / KIDNEY: LKM1 Ab: 1 Units (ref 0.0–20.0)

## 2020-10-09 LAB — ANA W/REFLEX: Anti Nuclear Antibody (ANA): NEGATIVE

## 2020-10-09 LAB — ANTI-SMOOTH MUSCLE ANTIBODY, IGG: Smooth Muscle Ab: 30 Units — ABNORMAL HIGH (ref 0–19)

## 2020-10-09 LAB — SOLUBLE LIVER AG (IGG AB): Anti-SLA, IgG: 1.4 units (ref 0.0–20.0)

## 2020-10-09 LAB — MITOCHONDRIAL ANTIBODIES: Mitochondrial Ab: <20 Units (ref 0.0–20.0)

## 2020-11-11 DIAGNOSIS — H6691 Otitis media, unspecified, right ear: Secondary | ICD-10-CM | POA: Diagnosis not present

## 2020-11-17 ENCOUNTER — Telehealth: Payer: Self-pay | Admitting: Allergy and Immunology

## 2020-11-17 NOTE — Telephone Encounter (Signed)
Patient called office and is wondering if his referral to Dr. Earlean Shawl was made.

## 2020-11-18 NOTE — Telephone Encounter (Signed)
Sent MyChart message to patient apologizing for the delay, saying that we thought he was already an established patient with Dr. Earlean Shawl, but would be glad to send that referral today.  I apologized for the delay and confusion. Told him we would keep him updated on referral progress.  Faxed referral request to Dr. Earlean Shawl at [phone].

## 2020-11-20 ENCOUNTER — Telehealth: Payer: Self-pay

## 2020-11-20 NOTE — Telephone Encounter (Signed)
Called patient to follow-up with him regarding referral and he said that Dr. Liliane Channel office reached out to him already and he is scheduled to see Dr. Earlean Shawl in about 3 to 4 weeks.

## 2020-11-20 NOTE — Telephone Encounter (Signed)
Talked with patient and he is having trouble with insurance and incorrect billing.  Patient said insurance company is saying the prick tests were unnecessary amount of tests and are not paying for it correctly. Patient has not paid bill yet and is wanting to get this straightened out before he pays the bill.  Please help! Thank you.

## 2020-11-25 DIAGNOSIS — H9201 Otalgia, right ear: Secondary | ICD-10-CM | POA: Diagnosis not present

## 2020-12-17 DIAGNOSIS — K760 Fatty (change of) liver, not elsewhere classified: Secondary | ICD-10-CM | POA: Diagnosis not present

## 2020-12-17 DIAGNOSIS — I10 Essential (primary) hypertension: Secondary | ICD-10-CM | POA: Diagnosis not present

## 2020-12-17 DIAGNOSIS — R7989 Other specified abnormal findings of blood chemistry: Secondary | ICD-10-CM | POA: Diagnosis not present

## 2020-12-23 DIAGNOSIS — R7303 Prediabetes: Secondary | ICD-10-CM | POA: Diagnosis not present

## 2020-12-23 DIAGNOSIS — E785 Hyperlipidemia, unspecified: Secondary | ICD-10-CM | POA: Diagnosis not present

## 2020-12-23 DIAGNOSIS — Z125 Encounter for screening for malignant neoplasm of prostate: Secondary | ICD-10-CM | POA: Diagnosis not present

## 2020-12-29 DIAGNOSIS — Z1331 Encounter for screening for depression: Secondary | ICD-10-CM | POA: Diagnosis not present

## 2020-12-29 DIAGNOSIS — E785 Hyperlipidemia, unspecified: Secondary | ICD-10-CM | POA: Diagnosis not present

## 2020-12-29 DIAGNOSIS — Z0000 Encounter for general adult medical examination without abnormal findings: Secondary | ICD-10-CM | POA: Diagnosis not present

## 2020-12-29 DIAGNOSIS — Z1389 Encounter for screening for other disorder: Secondary | ICD-10-CM | POA: Diagnosis not present

## 2021-02-17 ENCOUNTER — Telehealth: Payer: Self-pay | Admitting: Allergy and Immunology

## 2021-02-17 DIAGNOSIS — R7989 Other specified abnormal findings of blood chemistry: Secondary | ICD-10-CM | POA: Diagnosis not present

## 2021-02-17 NOTE — Telephone Encounter (Signed)
Pt called stating he has been billed incorrectly. Would like to speak to someone about this.   [phone]

## 2021-02-18 DIAGNOSIS — R748 Abnormal levels of other serum enzymes: Secondary | ICD-10-CM | POA: Diagnosis not present

## 2021-02-25 DIAGNOSIS — R972 Elevated prostate specific antigen [PSA]: Secondary | ICD-10-CM | POA: Diagnosis not present

## 2021-04-10 DIAGNOSIS — M1712 Unilateral primary osteoarthritis, left knee: Secondary | ICD-10-CM | POA: Diagnosis not present

## 2021-04-10 DIAGNOSIS — M170 Bilateral primary osteoarthritis of knee: Secondary | ICD-10-CM | POA: Diagnosis not present

## 2021-06-10 DIAGNOSIS — R7989 Other specified abnormal findings of blood chemistry: Secondary | ICD-10-CM | POA: Diagnosis not present

## 2021-06-17 DIAGNOSIS — R748 Abnormal levels of other serum enzymes: Secondary | ICD-10-CM | POA: Diagnosis not present

## 2021-12-18 DIAGNOSIS — H15102 Unspecified episcleritis, left eye: Secondary | ICD-10-CM | POA: Diagnosis not present

## 2021-12-18 DIAGNOSIS — H524 Presbyopia: Secondary | ICD-10-CM | POA: Diagnosis not present

## 2022-01-01 DIAGNOSIS — I10 Essential (primary) hypertension: Secondary | ICD-10-CM | POA: Diagnosis not present

## 2022-01-01 DIAGNOSIS — R7303 Prediabetes: Secondary | ICD-10-CM | POA: Diagnosis not present

## 2022-01-01 DIAGNOSIS — Z125 Encounter for screening for malignant neoplasm of prostate: Secondary | ICD-10-CM | POA: Diagnosis not present

## 2022-01-08 DIAGNOSIS — Z1339 Encounter for screening examination for other mental health and behavioral disorders: Secondary | ICD-10-CM | POA: Diagnosis not present

## 2022-01-08 DIAGNOSIS — Z0000 Encounter for general adult medical examination without abnormal findings: Secondary | ICD-10-CM | POA: Diagnosis not present

## 2022-01-08 DIAGNOSIS — R220 Localized swelling, mass and lump, head: Secondary | ICD-10-CM | POA: Diagnosis not present

## 2022-01-08 DIAGNOSIS — Z1331 Encounter for screening for depression: Secondary | ICD-10-CM | POA: Diagnosis not present

## 2023-01-10 DIAGNOSIS — H02402 Unspecified ptosis of left eyelid: Secondary | ICD-10-CM | POA: Diagnosis not present

## 2023-01-14 DIAGNOSIS — R7303 Prediabetes: Secondary | ICD-10-CM | POA: Diagnosis not present

## 2023-01-14 DIAGNOSIS — R972 Elevated prostate specific antigen [PSA]: Secondary | ICD-10-CM | POA: Diagnosis not present

## 2023-01-14 DIAGNOSIS — E785 Hyperlipidemia, unspecified: Secondary | ICD-10-CM | POA: Diagnosis not present

## 2023-01-21 DIAGNOSIS — Z1331 Encounter for screening for depression: Secondary | ICD-10-CM | POA: Diagnosis not present

## 2023-01-21 DIAGNOSIS — Z1339 Encounter for screening examination for other mental health and behavioral disorders: Secondary | ICD-10-CM | POA: Diagnosis not present

## 2023-01-21 DIAGNOSIS — R82998 Other abnormal findings in urine: Secondary | ICD-10-CM | POA: Diagnosis not present

## 2023-01-21 DIAGNOSIS — I10 Essential (primary) hypertension: Secondary | ICD-10-CM | POA: Diagnosis not present

## 2023-01-21 DIAGNOSIS — Z0000 Encounter for general adult medical examination without abnormal findings: Secondary | ICD-10-CM | POA: Diagnosis not present

## 2023-01-21 DIAGNOSIS — H0520 Unspecified exophthalmos: Secondary | ICD-10-CM | POA: Diagnosis not present

## 2023-02-08 DIAGNOSIS — K754 Autoimmune hepatitis: Secondary | ICD-10-CM | POA: Diagnosis not present

## 2023-02-08 DIAGNOSIS — H0520 Unspecified exophthalmos: Secondary | ICD-10-CM | POA: Diagnosis not present

## 2023-02-24 ENCOUNTER — Encounter: Payer: Self-pay | Admitting: Neurology

## 2023-03-28 ENCOUNTER — Encounter: Payer: Self-pay | Admitting: Neurology

## 2023-03-28 ENCOUNTER — Ambulatory Visit (INDEPENDENT_AMBULATORY_CARE_PROVIDER_SITE_OTHER): Payer: BC Managed Care – PPO | Admitting: Neurology

## 2023-03-28 VITALS — BP 149/90 | HR 73 | Ht 71.0 in | Wt 217.0 lb

## 2023-03-28 DIAGNOSIS — G7001 Myasthenia gravis with (acute) exacerbation: Secondary | ICD-10-CM

## 2023-03-28 DIAGNOSIS — R058 Other specified cough: Secondary | ICD-10-CM

## 2023-03-28 MED ORDER — PYRIDOSTIGMINE BROMIDE 60 MG PO TABS: 60.0000 mg | ORAL_TABLET | Freq: Two times a day (BID) | ORAL | 3 refills | Status: AC

## 2023-03-28 NOTE — Progress Notes (Signed)
[facility] Clinic Note - Initial Visit   Date: 03/28/2023   Evan Wallace MRN: [id number] DOB: 1960/09/19   Dear Dr. Thornell Mule:  Thank you for your kind referral of Evan Wallace for consultation of myasthenia gravis. Although his history is well known to you, please allow [place] to reiterate it for the purpose of our medical record. The patient was accompanied to the clinic by wife who also provides collateral information.     Evan Wallace is a 62 y.o. left-handed male with hypertension, history of thoracic syrinx, and prior tobacco use presenting for evaluation of myasthenia gravis.   IMPRESSION/PLAN: Seropositive ocular myasthenia gravis, diagnosed 02/2023.  Exam shows trace left ptosis.  No bulbar or limb weakness.  I had an extensive discussion with the patient regarding the diagnosis, pathogenesis, prognosis, management plan, meds to avoid, and potential exacerbation of myasthenia gravis. He was also provided with a list of medications that can worsen MG and that he should avoid.  Fortunately, his symptoms are very mild and we may be able to manage with mestinon alone.   - CT chest w contrast to evaluate thymus gland  - Continue mestinon 60 twice daily as needed  - Consider prednisone only if symptoms get worse  Return to clinic in 2 months or sooner as needed  ------------------------------------------------------------- History of present illness: Around September, his wife noticed that his left eyelid was droopy. Since onset, he has fluctuating droopiness of the left eyelid.  Once in a while, wife feels that it has almost completely closed, but for the last several weeks, he has not noticed any problems.  He denies double vision, difficulty with swallowing/talking, or limb weakness.   He saw his PCP and ophthalmologist who had concern for myasthenia  gravis.  AChR antibody testing returned positive.   He works as Actor in produce.  He lives at  home with his wife.  They have three grown sons.   Out-side paper records, electronic medical record, and images have been reviewed where available and summarized as:  Labs 02/08/2023:  AChR binding 16.1*, blocking 43*, modifying 69*, striational antibody 1:100*  Lab Results  Component Value Date   HGBA1C 6.2 10/18/2016   Lab Results  Component Value Date   TSH 2.510 09/24/2020    Past Medical History:  Diagnosis Date   Back pain    Colon polyps    Hypertension    Kidney stones    Syrinx of spinal cord - on MRI 08/2013, followed by orthopedics 11/30/2013   Tobacco use disorder 11/13/2013    Past Surgical History:  Procedure Laterality Date   HERNIA REPAIR     KNEE ARTHROSCOPY Left 12/01/2015   TONSILLECTOMY  1967     Medications:  Outpatient Encounter Medications as of 03/28/2023  Medication Sig   amLODipine (NORVASC) 10 MG tablet Take 1 tablet by mouth daily.   EPINEPHrine (AUVI-Q) 0.3 mg/0.3 mL IJ SOAJ injection Inject 0.3 mg into the muscle as needed for anaphylaxis.   [DISCONTINUED] hydrochlorothiazide (HYDRODIURIL) 25 MG tablet Take 1 tablet by mouth daily. (Patient not taking: Reported on 03/28/2023)   No facility-administered encounter medications on file as of 03/28/2023.    Allergies: No Known Allergies  Family History: Family History  Problem Relation Age of Onset   Hypertension Mother        not until in her 88's   Allergic rhinitis Mother    Glaucoma Mother    Glaucoma Father    Hypertension  Father    Kidney disease Father    Cancer Father 58       colon cancer   Alzheimer's disease Father    Eczema Grandchild    Asthma Neg Hx    Urticaria Neg Hx    Angioedema Neg Hx     Social History: Social History   Tobacco Use   Smoking status: Former    Current packs/day: 0.00    Average packs/day: 1 pack/day for 10.0 years (10.0 ttl pk-yrs)    Types: Cigarettes    Start date: 12/22/2003    Quit date: 12/21/2013    Years since quitting: 9.2    Smokeless tobacco: Never  Vaping Use   Vaping status: Never Used  Substance Use Topics   Alcohol use: No   Drug use: Never   Social History   Social History Narrative   Work or School: works for [employer] - manager/stockerHome Situation: lives with wifeSpiritual Beliefs: methodistLifestyle: no regular exercise; so so      Are you right handed or left handed? Left Handed    Are you currently employed ? 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 No    Who lives with you? Wife    What type of home do you live in: 1 story or 2 story? Lives in a one story home        Vital Signs:  BP (!) 149/90   Pulse 73   Ht 5\' 11"  (1.803 m)   Wt 217 lb (98.4 kg)   SpO2 97%   BMI 30.27 kg/m    Neurological Exam: MENTAL STATUS including orientation to time, place, person, recent and remote memory, attention span and concentration, language, and fund of knowledge is normal.  Speech is not dysarthric.  CRANIAL NERVES: II:  No visual field defects.     III-IV-VI: Pupils equal round and reactive to light.  Normal conjugate, extra-ocular eye movements in all directions of gaze.  No nystagmus.  Trace left ptosis at baseline, no worsening with sustained upgaze V:  Normal facial sensation.    VII:  Normal facial symmetry and movements.   VIII:  Normal hearing and vestibular function.   IX-X:  Normal palatal movement.   XI:  Normal shoulder shrug and head rotation.   XII:  Normal tongue strength and range of motion, no deviation or fasciculation.  MOTOR:  No atrophy, fasciculations or abnormal movements.  No pronator drift.   Upper Extremity:  Right  Left  Deltoid  5/5   5/5   Biceps  5/5   5/5   Triceps  5/5   5/5   Wrist extensors  5/5   5/5   Wrist flexors  5/5   5/5   Finger extensors  5/5   5/5   Finger flexors  5/5   5/5   Dorsal interossei  5/5   5/5   Tone (Ashworth scale)  0  0   Lower Extremity:  Right  Left  Hip flexors   5/5   5/5   Knee flexors  5/5   5/5   Knee extensors  5/5   5/5   Dorsiflexors  5/5   5/5   Plantarflexors  5/5   5/5   Toe extensors  5/5   5/5   Toe flexors  5/5   5/5   Tone (Ashworth scale)  0  0   MSRs:  Right        Left brachioradialis 2+  2+  biceps 2+  2+  triceps 2+  2+  patellar 2+  2+  ankle jerk 2+  2+  plantar response down  down   SENSORY:  Normal and symmetric perception of light touch and vibration  COORDINATION/GAIT: Normal finger-to- nose-finger.  Intact rapid alternating movements bilaterally. Gait narrow based and stable. Tandem and stressed gait intact.    Thank you for allowing me to participate in patient's care.  If I can answer any additional questions, I would be pleased to do so.    Sincerely,    Ashni Lonzo K. Allena Katz, DO

## 2023-04-05 ENCOUNTER — Encounter: Payer: Self-pay | Admitting: Neurology

## 2023-04-07 ENCOUNTER — Ambulatory Visit
Admission: RE | Admit: 2023-04-07 | Discharge: 2023-04-07 | Disposition: A | Discharge status: Home / Self Care | Payer: BC Managed Care – PPO | Source: Ambulatory Visit | Attending: Neurology | Admitting: Neurology

## 2023-04-07 DIAGNOSIS — G7001 Myasthenia gravis with (acute) exacerbation: Secondary | ICD-10-CM | POA: Diagnosis not present

## 2023-04-07 DIAGNOSIS — R058 Other specified cough: Secondary | ICD-10-CM | POA: Diagnosis not present

## 2023-04-07 MED ORDER — IOPAMIDOL (ISOVUE-300) INJECTION 61%
75.0000 mL | Freq: Once | INTRAVENOUS | Status: AC | PRN
  Administered 2023-04-07: 75 mL via INTRAVENOUS

## 2023-05-25 ENCOUNTER — Ambulatory Visit: Payer: BC Managed Care – PPO | Admitting: Neurology

## 2023-05-25 ENCOUNTER — Encounter: Payer: Self-pay | Admitting: Neurology

## 2023-05-25 VITALS — BP 156/89 | HR 59 | Ht 71.0 in | Wt 220.0 lb

## 2023-05-25 DIAGNOSIS — G7001 Myasthenia gravis with (acute) exacerbation: Secondary | ICD-10-CM | POA: Diagnosis not present

## 2023-05-25 NOTE — Progress Notes (Signed)
Follow-up Visit   Date: 05/25/2023    PANTH HIEB MRN: [id number] DOB: Apr 17, 1961    Evan Wallace is a 63 y.o. left-handed Caucasian male with hypertension, history of thoracic syrinx, and prior tobacco use returning to the clinic for follow-up of seropositive myasthenia gravis.  The patient was accompanied to the clinic by self.   IMPRESSION/PLAN: Seropositive ocular myasthenia gravis, diagnosed 02/2023, thymoma negative.  Exam continues to show trace left ptosis.  No bulbar or limb weakness.  Fortunately, he has been stable and not needed to treat his symptoms.  He has mestinon 60mg  and knows to take this as needed.  Patient asked to monitor symptoms especially as warmer temperature can aggravate MG symptoms.   Return to clinic in 4 months  --------------------------------------------- History of present illness: Around September, his wife noticed that his left eyelid was droopy. Since onset, he has fluctuating droopiness of the left eyelid.  Once in a while, wife feels that it has almost completely closed, but for the last several weeks, he has not noticed any problems.  He denies double vision, difficulty with swallowing/talking, or limb weakness.   He saw his PCP and ophthalmologist who had concern for myasthenia  gravis.  AChR antibody testing returned positive.   He works as Actor in produce.  He lives at home with his wife.  They have three grown sons.   UPDATE 05/25/2023:  He is here for follow-up visit.  He reports doing well and has not needed to take mestinon. He occasionally had left eye droopiness which occurs 2-3 times every month.  He denies double vision, difficulty with swallowing/speech, or limb weakness.    Medications:  Current Outpatient Medications on File Prior to Visit  Medication Sig Dispense Refill   amLODipine (NORVASC) 10 MG tablet Take 1 tablet by mouth daily.     EPINEPHrine (AUVI-Q) 0.3 mg/0.3 mL IJ SOAJ injection Inject 0.3 mg into the  muscle as needed for anaphylaxis. 2 each 1   pyridostigmine (MESTINON) 60 MG tablet Take 1 tablet (60 mg total) by mouth 2 (two) times daily. (Patient taking differently: Take 60 mg by mouth 2 (two) times daily. Takes as needed.) 60 tablet 3   No current facility-administered medications on file prior to visit.    Allergies: No Known Allergies  Vital Signs:  BP (!) 156/89   Pulse (!) 59   Ht 5\' 11"  (1.803 m)   Wt 220 lb (99.8 kg)   SpO2 98%   BMI 30.68 kg/m   Neurological Exam: MENTAL STATUS including orientation to time, place, person, recent and remote memory, attention span and concentration, language, and fund of knowledge is normal.  Speech is not dysarthric.  CRANIAL NERVES:  Pupils equal round and reactive to light.  Normal conjugate, extra-ocular eye movements in all directions of gaze.  Mild left ptosis at rest without significant worsening with sustained upgaze.  Face is symmetric. Orbicular oculi, oribicular oris, and buccinator is 5/5. Palate elevates symmetrically.  Tongue is midline and strength is 5/5.  MOTOR:  Motor strength is 5/5 in all extremities.  No atrophy, fasciculations or abnormal movements.  No pronator drift.  Tone is normal.    COORDINATION/GAIT:    Gait narrow based and stable.   Data: Labs 02/08/2023: AChR binding 16.1*, blocking 43*, modifying 69*, striational antibody 1:100*   CT chest w contrast 04/16/2024:  No anterior mediastinal soft tissue to indicate thymic remnant or thymoma.    Thank you for allowing  me to participate in patient's care.  If I can answer any additional questions, I would be pleased to do so.    Sincerely,    Muhammadali Ries K. Allena Katz, DO

## 2023-07-22 DIAGNOSIS — M25561 Pain in right knee: Secondary | ICD-10-CM | POA: Diagnosis not present

## 2023-07-27 DIAGNOSIS — M170 Bilateral primary osteoarthritis of knee: Secondary | ICD-10-CM | POA: Diagnosis not present

## 2023-07-27 DIAGNOSIS — M25561 Pain in right knee: Secondary | ICD-10-CM | POA: Diagnosis not present

## 2023-09-05 ENCOUNTER — Ambulatory Visit: Payer: BC Managed Care – PPO | Admitting: Neurology

## 2023-11-21 ENCOUNTER — Ambulatory Visit: Admitting: Neurology

## 2024-01-08 DIAGNOSIS — H6501 Acute serous otitis media, right ear: Secondary | ICD-10-CM | POA: Diagnosis not present

## 2024-01-08 DIAGNOSIS — H9191 Unspecified hearing loss, right ear: Secondary | ICD-10-CM | POA: Diagnosis not present

## 2024-01-18 DIAGNOSIS — H9201 Otalgia, right ear: Secondary | ICD-10-CM | POA: Diagnosis not present

## 2024-01-18 DIAGNOSIS — H6691 Otitis media, unspecified, right ear: Secondary | ICD-10-CM | POA: Diagnosis not present

## 2024-01-31 DIAGNOSIS — H6981 Other specified disorders of Eustachian tube, right ear: Secondary | ICD-10-CM | POA: Diagnosis not present

## 2024-01-31 DIAGNOSIS — H6501 Acute serous otitis media, right ear: Secondary | ICD-10-CM | POA: Diagnosis not present

## 2024-03-01 DIAGNOSIS — H6981 Other specified disorders of Eustachian tube, right ear: Secondary | ICD-10-CM | POA: Diagnosis not present

## 2024-04-02 NOTE — Progress Notes (Unsigned)
 Follow-up Visit   Date: 04/03/2024    Evan Wallace MRN: [id number] DOB: May 27, 1960    Evan Wallace is a 63 y.o. left-handed Caucasian male with hypertension, history of thoracic syrinx, and prior tobacco use returning to the clinic for follow-up of seropositive myasthenia gravis.  The patient was accompanied to the clinic by self.   IMPRESSION/PLAN: Seropositive ocular myasthenia gravis, diagnosed 02/2023, thymoma negative.  Exam continues to show trace left ptosis.  No bulbar or limb weakness.  Fortunately, he has been stable and not needed to treat his symptoms.  He has mestinon  60mg  and knows to take this as needed.  Patient asked to monitor symptoms especially as warmer temperature can aggravate MG symptoms.   Return to clinic in 4 months  --------------------------------------------- History of present illness: Around September, his wife noticed that his left eyelid was droopy. Since onset, he has fluctuating droopiness of the left eyelid.  Once in a while, wife feels that it has almost completely closed, but for the last several weeks, he has not noticed any problems.  He denies double vision, difficulty with swallowing/talking, or limb weakness.   He saw his PCP and ophthalmologist who had concern for myasthenia  gravis.  AChR antibody testing returned positive.   He works as Actor in produce.  He lives at home with his wife.  They have three grown sons.   UPDATE 05/25/2023:  He is here for follow-up visit.  He reports doing well and has not needed to take mestinon . He occasionally had left eye droopiness which occurs 2-3 times every month.  He denies double vision, difficulty with swallowing/speech, or limb weakness.    UPDATE 04/02/2024:  ***  Medications:  Current Outpatient Medications on File Prior to Visit  Medication Sig Dispense Refill   amLODipine  (NORVASC ) 10 MG tablet Take 1 tablet by mouth daily.     EPINEPHrine  (AUVI-Q ) 0.3 mg/0.3 mL IJ SOAJ  injection Inject 0.3 mg into the muscle as needed for anaphylaxis. 2 each 1   pyridostigmine  (MESTINON ) 60 MG tablet Take 1 tablet (60 mg total) by mouth 2 (two) times daily. (Patient taking differently: Take 60 mg by mouth 2 (two) times daily. Takes as needed.) 60 tablet 3   No current facility-administered medications on file prior to visit.    Allergies: No Known Allergies  Vital Signs:  There were no vitals taken for this visit.  Neurological Exam: MENTAL STATUS including orientation to time, place, person, recent and remote memory, attention span and concentration, language, and fund of knowledge is normal.  Speech is not dysarthric.  CRANIAL NERVES:  Pupils equal round and reactive to light.  Normal conjugate, extra-ocular eye movements in all directions of gaze.  Mild left ptosis at rest without significant worsening with sustained upgaze.  Face is symmetric. Orbicular oculi, oribicular oris, and buccinator is 5/5. Palate elevates symmetrically.  Tongue is midline and strength is 5/5.  MOTOR:  Motor strength is 5/5 in all extremities.  No atrophy, fasciculations or abnormal movements.  No pronator drift.  Tone is normal.    COORDINATION/GAIT:    Gait narrow based and stable.   Data: Labs 02/08/2023: AChR binding 16.1*, blocking 43*, modifying 69*, striational antibody 1:100*   CT chest w contrast 04/16/2024:  No anterior mediastinal soft tissue to indicate thymic remnant or thymoma.    Thank you for allowing me to participate in patient's care.  If I can answer any additional questions, I would be pleased to  do so.    Sincerely,    Maygan Koeller K. Tobie, DO

## 2024-04-03 ENCOUNTER — Encounter: Payer: Self-pay | Admitting: Neurology

## 2024-04-03 ENCOUNTER — Ambulatory Visit (INDEPENDENT_AMBULATORY_CARE_PROVIDER_SITE_OTHER): Admitting: Neurology

## 2024-04-03 VITALS — BP 154/92 | HR 68 | Ht 71.0 in | Wt 223.0 lb

## 2024-04-03 DIAGNOSIS — G7000 Myasthenia gravis without (acute) exacerbation: Secondary | ICD-10-CM

## 2025-04-03 ENCOUNTER — Ambulatory Visit: Admitting: Neurology
